# Patient Record
Sex: Female | Born: 1960 | Race: White | Hispanic: No | Marital: Married | Smoking: Never smoker
Health system: Southern US, Community
[De-identification: ages and names within clinical notes are randomized; demographics above are authoritative.]

## PROBLEM LIST (undated history)

## (undated) DIAGNOSIS — M1990 Unspecified osteoarthritis, unspecified site: Secondary | ICD-10-CM

## (undated) DIAGNOSIS — I10 Essential (primary) hypertension: Secondary | ICD-10-CM

## (undated) DIAGNOSIS — D219 Benign neoplasm of connective and other soft tissue, unspecified: Secondary | ICD-10-CM

## (undated) HISTORY — DX: Unspecified osteoarthritis, unspecified site: M19.90

## (undated) HISTORY — DX: Benign neoplasm of connective and other soft tissue, unspecified: D21.9

## (undated) HISTORY — DX: Essential (primary) hypertension: I10

---
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
## 1999-08-20 ENCOUNTER — Other Ambulatory Visit: Admission: RE | Admit: 1999-08-20 | Discharge: 1999-08-20 | Payer: Self-pay | Admitting: Obstetrics and Gynecology

## 2000-08-18 ENCOUNTER — Other Ambulatory Visit: Admission: RE | Admit: 2000-08-18 | Discharge: 2000-08-18 | Payer: Self-pay | Admitting: Obstetrics and Gynecology

## 2001-09-19 ENCOUNTER — Other Ambulatory Visit: Admission: RE | Admit: 2001-09-19 | Discharge: 2001-09-19 | Payer: Self-pay | Admitting: Obstetrics and Gynecology

## 2002-06-06 ENCOUNTER — Ambulatory Visit (HOSPITAL_COMMUNITY): Admission: RE | Admit: 2002-06-06 | Discharge: 2002-06-06 | Payer: Self-pay | Admitting: Obstetrics and Gynecology

## 2002-06-06 ENCOUNTER — Encounter: Payer: Self-pay | Admitting: Obstetrics and Gynecology

## 2002-09-22 ENCOUNTER — Other Ambulatory Visit: Admission: RE | Admit: 2002-09-22 | Discharge: 2002-09-22 | Payer: Self-pay | Admitting: Obstetrics and Gynecology

## 2003-10-10 ENCOUNTER — Ambulatory Visit (HOSPITAL_COMMUNITY): Admission: RE | Admit: 2003-10-10 | Discharge: 2003-10-10 | Payer: Self-pay | Admitting: Obstetrics and Gynecology

## 2003-10-17 ENCOUNTER — Other Ambulatory Visit: Admission: RE | Admit: 2003-10-17 | Discharge: 2003-10-17 | Payer: Self-pay | Admitting: Obstetrics and Gynecology

## 2005-01-07 ENCOUNTER — Other Ambulatory Visit: Admission: RE | Admit: 2005-01-07 | Discharge: 2005-01-07 | Payer: Self-pay | Admitting: Obstetrics and Gynecology

## 2005-02-18 ENCOUNTER — Ambulatory Visit (HOSPITAL_COMMUNITY): Admission: RE | Admit: 2005-02-18 | Discharge: 2005-02-18 | Payer: Self-pay | Admitting: Obstetrics and Gynecology

## 2006-03-10 ENCOUNTER — Ambulatory Visit (HOSPITAL_COMMUNITY): Admission: RE | Admit: 2006-03-10 | Discharge: 2006-03-10 | Payer: Self-pay | Admitting: Obstetrics and Gynecology

## 2006-08-26 ENCOUNTER — Other Ambulatory Visit: Admission: RE | Admit: 2006-08-26 | Discharge: 2006-08-26 | Payer: Self-pay | Admitting: Obstetrics and Gynecology

## 2007-04-07 ENCOUNTER — Ambulatory Visit (HOSPITAL_COMMUNITY): Admission: RE | Admit: 2007-04-07 | Discharge: 2007-04-07 | Payer: Self-pay | Admitting: Obstetrics and Gynecology

## 2007-05-26 HISTORY — PX: ANKLE FRACTURE SURGERY: SHX122

## 2007-09-01 ENCOUNTER — Other Ambulatory Visit: Admission: RE | Admit: 2007-09-01 | Discharge: 2007-09-01 | Payer: Self-pay | Admitting: Obstetrics and Gynecology

## 2008-07-19 ENCOUNTER — Ambulatory Visit (HOSPITAL_COMMUNITY): Admission: RE | Admit: 2008-07-19 | Discharge: 2008-07-19 | Payer: Self-pay | Admitting: Obstetrics and Gynecology

## 2008-12-06 ENCOUNTER — Inpatient Hospital Stay (HOSPITAL_COMMUNITY): Admission: EM | Admit: 2008-12-06 | Discharge: 2008-12-07 | Payer: Self-pay | Admitting: Emergency Medicine

## 2009-09-04 ENCOUNTER — Ambulatory Visit (HOSPITAL_COMMUNITY): Admission: RE | Admit: 2009-09-04 | Discharge: 2009-09-04 | Payer: Self-pay | Admitting: Obstetrics and Gynecology

## 2010-09-01 LAB — POCT I-STAT 4, (NA,K, GLUC, HGB,HCT)
Glucose, Bld: 107 mg/dL — ABNORMAL HIGH (ref 70–99)
HCT: 43 % (ref 36.0–46.0)
Sodium: 137 mEq/L (ref 135–145)

## 2010-09-04 ENCOUNTER — Other Ambulatory Visit: Payer: Self-pay | Admitting: Obstetrics and Gynecology

## 2010-09-04 DIAGNOSIS — Z1231 Encounter for screening mammogram for malignant neoplasm of breast: Secondary | ICD-10-CM

## 2010-09-11 ENCOUNTER — Ambulatory Visit (HOSPITAL_COMMUNITY)
Admission: RE | Admit: 2010-09-11 | Discharge: 2010-09-11 | Disposition: A | Discharge status: Home / Self Care | Payer: BC Managed Care – PPO | Source: Ambulatory Visit | Attending: Obstetrics and Gynecology | Admitting: Obstetrics and Gynecology

## 2010-09-11 DIAGNOSIS — Z1231 Encounter for screening mammogram for malignant neoplasm of breast: Secondary | ICD-10-CM

## 2010-10-07 NOTE — H&P (Signed)
[name], [name] NO.:  [id number]   MEDICAL RECORD NO.:  [id number]          PATIENT TYPE:  INP   LOCATION:  0098                         FACILITY:  [facility]   PHYSICIAN:  [name], M.D.DATE OF BIRTH:  [birth year]   DATE OF ADMISSION:  12/06/2008  DATE OF DISCHARGE:                              HISTORY & PHYSICAL   CHIEF COMPLAINT:  Painful swollen left ankle, left shoulder pain.   HISTORY OF PRESENT ILLNESS:  The patient is a 50 year old female.  She  states she was out walking her dogs today when she stepped wrong,  twisting her left ankle, falling to the ground landing on her left  shoulder.  The patient was brought to the emergency room for evaluation.  X-rays revealed a left ankle __________ trimalleolar fracture.  X-rays  of her shoulder were unremarkable.  The patient was consulted to Dr.  Thomasena Parker.  Dr. Thomasena Parker evaluated the patient, felt that the ankle was not  stable on the distal fibula side.  Recommended ORIF and the patient will  be admitted into the hospital.   PAST MEDICAL HISTORY:  Includes vertigo.   MEDICATIONS:  Meclizine p.r.n.   SOCIAL HISTORY:  Patient is married, lives with her husband.  She denies  any alcohol.  She denies any pregnancy.  She is a Occupational hygienist.   REVIEW OF SYSTEMS:  Negative for any respiratory issues.  Denies any  chest pains, no nausea or vomiting.  She does occasionally have some  issues related to vertigo.  She uses Meclizine with good results.  She  denies being pregnant.  Her last meal was last night.  She had an ice  slurpy at 8 o'clock this morning.   PHYSICAL EXAM:  VITALS:  Temperature was 98.1, pulse 77, respirations  16, blood pressure is 143/80.  GENERAL:  This is a healthy-appearing, well-developed female lying on a  hospital gurney.  She appears to be in no extreme distress.  HEENT:  Head was normal cephalic.  Gross exam intact.  NECK:  Supple.  Good range of motion.  CHEST:  Lung  sounds were clear and equal bilaterally without any  wheezes, rales, rhonchi.  HEART:  Regular rate and rhythm.  ABDOMEN:  Was soft, nontender.  Bowel sounds present.  EXTREMITIES:  Left upper extremity:  He patient was able to raise her  arm up over her shoulder.  She had good range of motion of the elbow and  wrist.  She was very tender at the [facility] joint.  She had no palpable  deformities of the clavicle.  She had good range of motion of her right  shoulder, right wrist and elbow.  LOWER EXTREMITIES:  The patient had full extension of her left hip.  She  could flex it up to 90 degrees.  She was able to bend her knee 0-90  degrees.  She has a swollen, very tender left ankle.  She had good  dorsalis pedis pulses, has good capillary refill.  She had no obvious  skin breakdown about the left ankle.  Exam of her right left lower  extremity was  normal.   Review of x-rays taken at [facility] showed she had a comminuted distal  fibula fracture with a posterior malleolar fracture that Dr. Thomasena Parker  felt was unstable.  X-rays of her left shoulder showed no obvious fractures or  displacements.   IMPRESSION:  1. Left ankle __________ bimalleolar fracture, unstable  2. Left shoulder pain, AC joint sprain.   DISPOSITION:  At this time agree with  patient's findings.  We will  admit [name] to [facility] under the care of Dr. Thomasena Parker.  We will take her to the OR for a planned open reduction internal  fixation of her left ankle.  We will get her up ambulating tomorrow with  physical therapy and we will allow her to discharge home with routine  followup.      [name], P.A.    ______________________________  [name], M.D.    RWK/MEDQ  D:  12/06/2008  T:  12/06/2008  Job:  [id number]   cc:   [name], M.D.  Fax: [phone]

## 2010-10-07 NOTE — Discharge Summary (Signed)
NAMETHRESEA, DOBLE NO.:  [id number]   MEDICAL RECORD NO.:  [id number]          PATIENT TYPE:  INP   LOCATION:  1606                         FACILITY:  [facility]   PHYSICIAN:  Erasmo Leventhal, M.D.DATE OF BIRTH:  03-Apr-1961   DATE OF ADMISSION:  12/06/2008  DATE OF DISCHARGE:  12/07/2008                               DISCHARGE SUMMARY   ADMISSION DIAGNOSIS:  Left ankle unstable bimalleolar fracture.   DISCHARGE DIAGNOSIS:  Open reduction internal fixation of left ankle,  unstable bimalleolar fracture.   HISTORY OF PRESENT ILLNESS:  The patient is a 50 year old female who was  out walking her dog on date of admission.  She twisted her ankle.  She  had immediate pain and swelling.  She was brought to the emergency room.  X-rays were taken of her left ankle and left shoulder.  The patient had  some soreness about the left shoulder in the [facility] joint.  X-rays of the  left shoulder were unremarkable.  X-rays of the left ankle were  interpreted by the radiologist as trimalleolar fracture, but after  evaluating the CT and looking at the x-rays, Dr. Thomasena Edis felt that it  was just a bimalleolar fracture with a posterior malleolus and unstable  comminuted fracture of the distal fibula.  The patient was admitted for  ORIF.   ALLERGIES:  No known drug allergies.   MEDICATIONS ON ADMISSION:  The patient was using vitamin E and  multivitamins.   SURGICAL PROCEDURE:  On December 06, 2008, the patient was taken to the OR  by Dr. Valma Cava, assisted by Dwyane Luo, P.A.-C.  Under general  anesthesia, the patient was underwent an ORIF of her left ankle.  The  patient tolerated the procedure well. There were no complications.  The  patient was transferred to the recovery room and to the orthopedic floor  in good condition.  On the orthopedic floor, the patient had some  significant nausea and vomiting throughout the first night, but this was  improved with IV medications.  The  patient was otherwise comfortable in  the morning without any nausea or vomiting.  She had good nice, pink  toes with good sensation and good motor.  She had no significant  swelling.  The patient was going to work with physical therapy today,  work on ambulation, nonweightbearing with use of crutches.  We are going  to discontinue the IV, and then we will discharge her home later this  afternoon for return to home for convalescence.  We will see her back in  2 weeks for routine casting check.   DISCHARGE INSTRUCTIONS:  Diet:  The patient discharged on a normal diet.   Activity:  The patient should basically be bed rest with the foot  elevated for the first couple of days but slowly may increase her  activity with use of crutches and nonweightbearing on the left leg.   Wound care:  She is to keep her cast clean and dry.   Follow-up:  She needs a follow-up appointment with Dr. Thomasena Edis 2 weeks  from today's date in the office.  The patient is to call [phone] for  that follow-up appointment.   Medications:  1. Percocet 5 mg 1 or 2 tablets every 4-6 hours for pain if needed.  2. Robaxin 500 mg 1 tablet every 6 hours for muscle spasms if needed.  3. Phenergan 1/2 to 1 tablets for nausea if needed.  4. Aspirin; one 325 aspirin a day.  5. Colace 100 mg twice a day while on narcotics, may get OTC.  6. MiraLax 17 g once a day for constipation if needed; may get OTC.  7. The patient is to resume routine home multivitamins and vitamin E.   CONDITION ON DISCHARGE:  To home is listed as improved and good.      Jamelle Rushing, P.A.    ______________________________  Erasmo Leventhal, M.D.    RWK/MEDQ  D:  12/07/2008  T:  12/07/2008  Job:  [id number]   cc:   Erasmo Leventhal, M.D.  Fax: [phone]

## 2010-10-07 NOTE — Op Note (Signed)
[name], [name] NO.:  [id number]   MEDICAL RECORD NO.:  [id number]          PATIENT TYPE:  INP   LOCATION:  0098                         FACILITY:  [facility]   PHYSICIAN:  Erasmo Leventhal, M.D.DATE OF BIRTH:  [birth year]   DATE OF PROCEDURE:  12/06/2008  DATE OF DISCHARGE:                               OPERATIVE REPORT   PREOPERATIVE DIAGNOSES:  1. Left ankle fracture.  2. Comminuted distal fibular fracture, unstable.  3. Associated posteromedial distal tibia fracture   POSTOPERATIVE DIAGNOSES:  1. Left ankle fracture.  2. Comminuted distal fibular fracture, unstable.  3. Associated posteromedial distal tibia fracture   PROCEDURE:  1. Open reduction, internal fixation of distal fibular fracture.  2. Indirect reduction of the posteromedial tibia fracture.  Stress x-      rays, C-arm radiography.   SURGEON:  Dr. Valma Cava.   ASSISTANT:  Dwyane Luo, P.A.-C.   ANESTHESIA:  General.   ESTIMATED BLOOD LOSS:  Less than 20 mL.   DRAINS:  None.   COMPLICATIONS:  None.   TOURNIQUET TIME:  Was 70 minutes at 300 mmHg.   COMPLICATIONS:  None.   DISPOSITION:  To the PACU, stable.   DESCRIPTION OF PROCEDURE:  The patient is counseled in the holding area.  The patient and side were identified. Taken to the operating room and  placed in the supine position and administered general anesthesia.  IV  antibiotics were given.  The cast was removed.  The lower extremity was  elevated.  The skin was closely checked and found be completely intact  circumferentially.  The lateral compartment was soft and good pulses.  Elevation and prepped with DuraPrep and draped in a sterile fashion.  Exsanguinated with an  Esmarch.  The tourniquet was elevated to 300  mmHg.  A longitudinal incision was made over the distal fibula through  the skin and subcutaneous tissue.  The periosteum was opened.  The  fracture was opened.  It was identified and found to be comminuted,  a  Weber type-B distal fibular fracture.  The fascia was opened and  thoroughly irrigated and debrided of soft tissue, and reduced  anatomically and securely fixed from anterior to posterior with a DePuy  titanium inter-fragmentary screw.  We used a DePuy small fragment set.  A composite plate was then properly chosen and bent to contour to the  appropriate size of fibula and held proximally.  At this point in time,  screws were placed distal and proximal alternating, giving excellent  fixation, with a combination of bicortical screws and also locking  screws.  The C-arm was utilized to confirm excellent placement of the  implants and reduction of the fracture.  Also that the posterior tibial  fracture was indirectly reduced by this mechanism of stabilization of  the fibula.  We were very pleased with the reduction and stabilization  of the fracture and implants.  Minor adjustments were made.  The wounds  were copiously irrigated.  The C-arm was again utilized to confirm  stability.   The periosteum was closed with Vicryl.  The subcu with Vicryl.  The  skin  was closed nicely with nylons, being very careful with the skin edges.  Then 10 mL of 0.25% Marcaine was placed in the wound edges at the end of  the case.  Sterile dressings were applied, along with a compressive  plaster splint.  The tourniquet was deflated.  Normal circulation to the  foot and ankle at the end of the case.  Ancef was given intravenously.   She tolerated the procedure well and there were no complications or  problems.  She was awakened and taken from the operating room to the  PACU in stable condition.   Help with the surgical technique and decision making by Mr. Dwyane Luo,  P.A.-C., who assisted throughout the entire case.           ______________________________  Erasmo Leventhal, M.D.     RAC/MEDQ  D:  12/06/2008  T:  12/06/2008  Job:  [id number]

## 2011-09-22 ENCOUNTER — Other Ambulatory Visit: Payer: Self-pay | Admitting: Obstetrics and Gynecology

## 2011-09-22 DIAGNOSIS — Z1231 Encounter for screening mammogram for malignant neoplasm of breast: Secondary | ICD-10-CM

## 2011-10-15 ENCOUNTER — Ambulatory Visit (HOSPITAL_COMMUNITY)
Admission: RE | Admit: 2011-10-15 | Discharge: 2011-10-15 | Disposition: A | Discharge status: Home / Self Care | Payer: BC Managed Care – PPO | Source: Ambulatory Visit | Attending: Obstetrics and Gynecology | Admitting: Obstetrics and Gynecology

## 2011-10-15 DIAGNOSIS — Z1231 Encounter for screening mammogram for malignant neoplasm of breast: Secondary | ICD-10-CM

## 2012-11-02 ENCOUNTER — Other Ambulatory Visit: Payer: Self-pay | Admitting: Obstetrics and Gynecology

## 2012-11-02 DIAGNOSIS — Z1231 Encounter for screening mammogram for malignant neoplasm of breast: Secondary | ICD-10-CM

## 2012-11-08 ENCOUNTER — Ambulatory Visit (HOSPITAL_COMMUNITY)
Admission: RE | Admit: 2012-11-08 | Discharge: 2012-11-08 | Disposition: A | Discharge status: Home / Self Care | Payer: BC Managed Care – PPO | Source: Ambulatory Visit | Attending: Obstetrics and Gynecology | Admitting: Obstetrics and Gynecology

## 2012-11-08 DIAGNOSIS — Z1231 Encounter for screening mammogram for malignant neoplasm of breast: Secondary | ICD-10-CM | POA: Insufficient documentation

## 2013-05-24 ENCOUNTER — Encounter: Payer: Self-pay | Admitting: Certified Nurse Midwife

## 2013-05-29 ENCOUNTER — Ambulatory Visit: Payer: Self-pay | Admitting: Certified Nurse Midwife

## 2013-06-06 ENCOUNTER — Encounter: Payer: Self-pay | Admitting: Certified Nurse Midwife

## 2013-06-06 ENCOUNTER — Ambulatory Visit (INDEPENDENT_AMBULATORY_CARE_PROVIDER_SITE_OTHER): Payer: BC Managed Care – PPO | Admitting: Certified Nurse Midwife

## 2013-06-06 VITALS — BP 112/78 | HR 72 | Resp 16 | Ht 63.25 in

## 2013-06-06 DIAGNOSIS — I10 Essential (primary) hypertension: Secondary | ICD-10-CM | POA: Insufficient documentation

## 2013-06-06 DIAGNOSIS — Z0000 Encounter for general adult medical examination without abnormal findings: Secondary | ICD-10-CM

## 2013-06-06 DIAGNOSIS — Z01419 Encounter for gynecological examination (general) (routine) without abnormal findings: Secondary | ICD-10-CM

## 2013-06-06 NOTE — Progress Notes (Signed)
Reviewed personally.  M. Suzanne Bertina Guthridge, MD.  

## 2013-06-06 NOTE — Progress Notes (Signed)
53 y.o. G0P0000 Married Caucasian Fe here for annual exam. Periods over the past 6 months " 2 real periods with amount and duration, and the other four months regular but small amount. Occasional hot flashes and irritability, no night sweats. No insomnia issues. Hypertension stable on medication with PCP management.Sees PCP every 6 months for labs and aex. No health issues today.  Patient's last menstrual period was 06/04/2013.          Sexually active: no  The current method of family planning is abstinence.    Exercising: yes  walking Smoker:  no  Health Maintenance: Pap:  05-11-12 neg HPV HR NEG MMG: 11-08-12 NORMAL Colonoscopy: none BMD:   none TDaP:  2009 Labs: none Self breast exam: done monthly   reports that she has never smoked. She does not have any smokeless tobacco history on file. She reports that she does not drink alcohol or use illicit drugs.  Past Medical History  Diagnosis Date  . Hypertension   . Fibroid     Past Surgical History  Procedure Laterality Date  . Appendectomy  1982  . Ankle fracture surgery Left 2009    Current Outpatient Prescriptions  Medication Sig Dispense Refill  . BIOTIN PO Take by mouth daily.      . cetirizine (ZYRTEC) 10 MG tablet Take 10 mg by mouth daily.      Marland Kitchen losartan (COZAAR) 50 MG tablet daily.      . Multiple Vitamin (MULTIVITAMIN) capsule Take 1 capsule by mouth daily.      . Omega-3 Fatty Acids (FISH OIL PO) Take by mouth daily.       No current facility-administered medications for this visit.    Family History  Problem Relation Age of Onset  . Cancer Mother     lung  . Diabetes Sister   . Cancer Father     brain  . Hypertension Father   . Cancer Maternal Grandmother     brain  . Cancer Maternal Grandfather     throat  . Stroke Paternal Grandfather     ROS:  Pertinent items are noted in HPI.  Otherwise, a comprehensive ROS was negative.  Exam:   BP 112/78  Pulse 72  Resp 16  Ht 5' 3.25" (1.607 m)  LMP  06/04/2013 Height: 5' 3.25" (160.7 cm)  Ht Readings from Last 3 Encounters:  06/06/13 5' 3.25" (1.607 m)    General appearance: alert, cooperative and appears stated age Head: Normocephalic, without obvious abnormality, atraumatic Neck: no adenopathy, supple, symmetrical, trachea midline and thyroid normal to inspection and palpation Lungs: clear to auscultation bilaterally Breasts: normal appearance, no masses or tenderness, No nipple retraction or dimpling, No nipple discharge or bleeding, No axillary or supraclavicular adenopathy Heart: regular rate and rhythm Abdomen: soft, non-tender; no masses,  no organomegaly Extremities: extremities normal, atraumatic, no cyanosis or edema Skin: Skin color, texture, turgor normal. No rashes or lesions Lymph nodes: Cervical, supraclavicular, and axillary nodes normal. No abnormal inguinal nodes palpated Neurologic: Grossly normal   Pelvic: External genitalia:  no lesions              Urethra:  normal appearing urethra with no masses, tenderness or lesions              Bartholin's and Skene's: normal                 Vagina: normal appearing vagina with normal color and discharge, no lesions  Cervix: normal, non tender              Pap taken: no Bimanual Exam:  Uterus:  enlarged, 10-12 weeks size and mid position, firm, history of fibroid no size change from previous exam              Adnexa: normal adnexa and no mass, fullness, tenderness               Rectovaginal: Confirms               Anus:  normal sphincter tone, no lesions  A:  Well Woman with normal exam  Perimenopausal with cycle changes  Contraception none, not sexually active, her preference  Hypertension on stable medication with PCP management  History of enlarged uterus with fibroid, no size change  P:   Reviewed health and wellness pertinent to exam  Discussed perimenopause and cycle expectations, has menses calendar with normal and abnormal parameters, will  advise if occurs.  Continue follow up as indicated  Pap smear as per guidelines   Mammogram yearly pap smear not taken today IFOB dispensed after discussion of risks and benefits of colonoscopy, patient does not want to schedule at this time.  counseled on breast self exam, mammography screening, menopause, adequate intake of calcium and vitamin D, diet and exercise, Kegel's exercises  return annually or prn  An After Visit Summary was printed and given to the patient.

## 2013-06-06 NOTE — Patient Instructions (Signed)
EXERCISE AND DIET:  We recommended that you start or continue a regular exercise program for good health. Regular exercise means any activity that makes your heart beat faster and makes you sweat.  We recommend exercising at least 30 minutes per day at least 3 days a week, preferably 4 or 5.  We also recommend a diet low in fat and sugar.  Inactivity, poor dietary choices and obesity can cause diabetes, heart attack, stroke, and kidney damage, among others.    ALCOHOL AND SMOKING:  Women should limit their alcohol intake to no more than 7 drinks/beers/glasses of wine (combined, not each!) per week. Moderation of alcohol intake to this level decreases your risk of breast cancer and liver damage. And of course, no recreational drugs are part of a healthy lifestyle.  And absolutely no smoking or even second hand smoke. Most people know smoking can cause heart and lung diseases, but did you know it also contributes to weakening of your bones? Aging of your skin?  Yellowing of your teeth and nails?  CALCIUM AND VITAMIN D:  Adequate intake of calcium and Vitamin D are recommended.  The recommendations for exact amounts of these supplements seem to change often, but generally speaking 600 mg of calcium (either carbonate or citrate) and 800 units of Vitamin D per day seems prudent. Certain women may benefit from higher intake of Vitamin D.  If you are among these women, your doctor will have told you during your visit.    PAP SMEARS:  Pap smears, to check for cervical cancer or precancers,  have traditionally been done yearly, although recent scientific advances have shown that most women can have pap smears less often.  However, every woman still should have a physical exam from her gynecologist every year. It will include a breast check, inspection of the vulva and vagina to check for abnormal growths or skin changes, a visual exam of the cervix, and then an exam to evaluate the size and shape of the uterus and  ovaries.  And after 51 years of age, a rectal exam is indicated to check for rectal cancers. We will also provide age appropriate advice regarding health maintenance, like when you should have certain vaccines, screening for sexually transmitted diseases, bone density testing, colonoscopy, mammograms, etc.   MAMMOGRAMS:  All women over 40 years old should have a yearly mammogram. Many facilities now offer a "3D" mammogram, which may cost around $50 extra out of pocket. If possible,  we recommend you accept the option to have the 3D mammogram performed.  It both reduces the number of women who will be called back for extra views which then turn out to be normal, and it is better than the routine mammogram at detecting truly abnormal areas.    COLONOSCOPY:  Colonoscopy to screen for colon cancer is recommended for all women at age 50.  We know, you hate the idea of the prep.  We agree, BUT, having colon cancer and not knowing it is worse!!  Colon cancer so often starts as a polyp that can be seen and removed at colonscopy, which can quite literally save your life!  And if your first colonoscopy is normal and you have no family history of colon cancer, most women don't have to have it again for 10 years.  Once every ten years, you can do something that may end up saving your life, right?  We will be happy to help you get it scheduled when you are ready.    Be sure to check your insurance coverage so you understand how much it will cost.  It may be covered as a preventative service at no cost, but you should check your particular policy.     Colonoscopy A colonoscopy is an exam to look at the entire large intestine (colon). This exam can help find problems such as tumors, polyps, inflammation, and areas of bleeding. The exam takes about 1 hour.  LET [facility] CARE PROVIDER KNOW ABOUT:   Any allergies you have.  All medicines you are taking, including vitamins, herbs, eye drops, creams, and over-the-counter  medicines.  Previous problems you or members of your family have had with the use of anesthetics.  Any blood disorders you have.  Previous surgeries you have had.  Medical conditions you have. RISKS AND COMPLICATIONS  Generally, this is a safe procedure. However, as with any procedure, complications can occur. Possible complications include:  Bleeding.  Tearing or rupture of the colon wall.  Reaction to medicines given during the exam.  Infection (rare). BEFORE THE PROCEDURE   Ask your health care provider about changing or stopping your regular medicines.  You may be prescribed an oral bowel prep. This involves drinking a large amount of medicated liquid, starting the day before your procedure. The liquid will cause you to have multiple loose stools until your stool is almost clear or light green. This cleans out your colon in preparation for the procedure.  Do not eat or drink anything else once you have started the bowel prep, unless your health care provider tells you it is safe to do so.  Arrange for someone to drive you home after the procedure. PROCEDURE   You will be given medicine to help you relax (sedative).  You will lie on your side with your knees bent.  A long, flexible tube with a light and camera on the end (colonoscope) will be inserted through the rectum and into the colon. The camera sends video back to a computer screen as it moves through the colon. The colonoscope also releases carbon dioxide gas to inflate the colon. This helps your health care provider see the area better.  During the exam, your health care provider may take a small tissue sample (biopsy) to be examined under a microscope if any abnormalities are found.  The exam is finished when the entire colon has been viewed. AFTER THE PROCEDURE   Do not drive for 24 hours after the exam.  You may have a small amount of blood in your stool.  You may pass moderate amounts of gas and have mild  abdominal cramping or bloating. This is caused by the gas used to inflate your colon during the exam.  Ask when your test results will be ready and how you will get your results. Make sure you get your test results. Document Released: 05/08/2000 Document Revised: 03/01/2013 Document Reviewed: 01/16/2013 [facility] Patient Information 2014 [employer].

## 2013-09-15 ENCOUNTER — Ambulatory Visit
Admission: RE | Admit: 2013-09-15 | Discharge: 2013-09-15 | Disposition: A | Discharge status: Home / Self Care | Payer: BC Managed Care – PPO | Source: Ambulatory Visit | Attending: Family Medicine | Admitting: Family Medicine

## 2013-09-15 ENCOUNTER — Other Ambulatory Visit: Payer: Self-pay | Admitting: Family Medicine

## 2013-09-15 DIAGNOSIS — R209 Unspecified disturbances of skin sensation: Secondary | ICD-10-CM

## 2013-09-29 ENCOUNTER — Encounter: Payer: BC Managed Care – PPO | Admitting: Neurology

## 2013-10-04 ENCOUNTER — Encounter (INDEPENDENT_AMBULATORY_CARE_PROVIDER_SITE_OTHER): Payer: Self-pay

## 2013-10-04 ENCOUNTER — Ambulatory Visit (INDEPENDENT_AMBULATORY_CARE_PROVIDER_SITE_OTHER): Payer: BC Managed Care – PPO | Admitting: Neurology

## 2013-10-04 ENCOUNTER — Ambulatory Visit (INDEPENDENT_AMBULATORY_CARE_PROVIDER_SITE_OTHER): Payer: Self-pay

## 2013-10-04 DIAGNOSIS — M79609 Pain in unspecified limb: Secondary | ICD-10-CM

## 2013-10-04 DIAGNOSIS — R209 Unspecified disturbances of skin sensation: Secondary | ICD-10-CM

## 2013-10-04 DIAGNOSIS — Z0289 Encounter for other administrative examinations: Secondary | ICD-10-CM

## 2013-10-04 NOTE — Procedures (Signed)
     HISTORY:  [name] is a 53 year old patient with a history of onset of swelling and discomfort in the right hand that began in early April 2015. She describes some numbness of the hand, and some neck discomfort. The patient is being evaluated for a possible neuropathy or a cervical radiculopathy.  NERVE CONDUCTION STUDIES:  Nerve conduction studies were performed on both upper extremities. The distal motor latencies and motor amplitudes for the median and ulnar nerves were within normal limits. The F wave latencies and nerve conduction velocities for these nerves were also normal. The sensory latencies for the median and ulnar nerves were normal.   EMG STUDIES:  EMG study was performed on the right upper extremity:  The first dorsal interosseous muscle reveals 2 to 4 K units with full recruitment. No fibrillations or positive waves were noted. The abductor pollicis brevis muscle reveals 2 to 4 K units with full recruitment. No fibrillations or positive waves were noted. The extensor indicis proprius muscle reveals 1 to 3 K units with full recruitment. No fibrillations or positive waves were noted. The pronator teres muscle reveals 2 to 3 K units with full recruitment. No fibrillations or positive waves were noted. The biceps muscle reveals 1 to 2 K units with full recruitment. No fibrillations or positive waves were noted. The triceps muscle reveals 2 to 4 K units with full recruitment. No fibrillations or positive waves were noted. The anterior deltoid muscle reveals 2 to 3 K units with full recruitment. No fibrillations or positive waves were noted. The cervical paraspinal muscles were tested at 2 levels. No abnormalities of insertional activity were seen at either level tested. There was good relaxation.   IMPRESSION:  Nerve conduction studies done on both upper extremities were within normal limits. No evidence of a neuropathy is seen. EMG evaluation of the right upper  extremity is normal, without evidence of an overlying cervical radiculopathy.  Jill Alexanders MD 10/04/2013 1:48 PM  [facility] [address] [place], [place] [address]  Phone [phone] Fax [phone]

## 2013-12-14 ENCOUNTER — Other Ambulatory Visit (HOSPITAL_COMMUNITY): Payer: Self-pay | Admitting: Obstetrics and Gynecology

## 2013-12-14 DIAGNOSIS — Z1231 Encounter for screening mammogram for malignant neoplasm of breast: Secondary | ICD-10-CM

## 2013-12-15 ENCOUNTER — Ambulatory Visit (HOSPITAL_COMMUNITY)
Admission: RE | Admit: 2013-12-15 | Discharge: 2013-12-15 | Disposition: A | Discharge status: Home / Self Care | Payer: BC Managed Care – PPO | Source: Ambulatory Visit | Attending: Obstetrics and Gynecology | Admitting: Obstetrics and Gynecology

## 2013-12-15 DIAGNOSIS — Z1231 Encounter for screening mammogram for malignant neoplasm of breast: Secondary | ICD-10-CM

## 2014-06-08 ENCOUNTER — Ambulatory Visit (INDEPENDENT_AMBULATORY_CARE_PROVIDER_SITE_OTHER): Payer: BLUE CROSS/BLUE SHIELD | Admitting: Certified Nurse Midwife

## 2014-06-08 ENCOUNTER — Encounter: Payer: Self-pay | Admitting: Certified Nurse Midwife

## 2014-06-08 VITALS — BP 120/82 | HR 68 | Resp 16 | Ht 63.25 in | Wt 214.0 lb

## 2014-06-08 DIAGNOSIS — Z124 Encounter for screening for malignant neoplasm of cervix: Secondary | ICD-10-CM

## 2014-06-08 DIAGNOSIS — Z0000 Encounter for general adult medical examination without abnormal findings: Secondary | ICD-10-CM

## 2014-06-08 DIAGNOSIS — N951 Menopausal and female climacteric states: Secondary | ICD-10-CM

## 2014-06-08 DIAGNOSIS — Z01419 Encounter for gynecological examination (general) (routine) without abnormal findings: Secondary | ICD-10-CM

## 2014-06-08 LAB — LIPID PANEL
CHOL/HDL RATIO: 3.2 ratio
CHOLESTEROL: 184 mg/dL (ref 0–200)
HDL: 58 mg/dL (ref >39)
LDL CALC: 109 mg/dL — AB (ref 0–99)
Triglycerides: 86 mg/dL (ref <150)
VLDL: 17 mg/dL (ref 0–40)

## 2014-06-08 LAB — POCT URINALYSIS DIPSTICK
Bilirubin, UA: NEGATIVE
Blood, UA: NEGATIVE
GLUCOSE UA: NEGATIVE
KETONES UA: NEGATIVE
Leukocytes, UA: NEGATIVE
NITRITE UA: NEGATIVE
PH UA: 5
PROTEIN UA: NEGATIVE
Urobilinogen, UA: NEGATIVE

## 2014-06-08 LAB — TSH: TSH: 1.9 u[IU]/mL (ref 0.350–4.500)

## 2014-06-08 NOTE — Progress Notes (Signed)
54 y.o. G0P0000 Married  Caucasian Fe here for annual exam. Periods none except for spotting " maybe twice". Continues with hot flashes with some irritability. Hypertension stable with PCP management. Working on weight loss and exercise. Labs with PCP are only CMP. Desires screening labs today. No other health issues today.  Patient's last menstrual period was 05/29/2014.          Sexually active: No.  The current method of family planning is none.    Exercising: Yes.    walk & eliptical Smoker:  no  Health Maintenance: Pap: 05-11-12 neg HPV HR neg MMG:  12-15-13 category c density,birads category 1:neg Colonoscopy:  none BMD:   none TDaP:  2009 Labs: Poct urine-neg Self breast exam: done monthly   reports that she has never smoked. She does not have any smokeless tobacco history on file. She reports that she does not drink alcohol or use illicit drugs.  Past Medical History  Diagnosis Date  . Hypertension   . Fibroid     Past Surgical History  Procedure Laterality Date  . Appendectomy  1982  . Ankle fracture surgery Left 2009    Current Outpatient Prescriptions  Medication Sig Dispense Refill  . cetirizine (ZYRTEC) 10 MG tablet Take 10 mg by mouth daily.    Marland Kitchen losartan (COZAAR) 50 MG tablet 25 mg daily.      No current facility-administered medications for this visit.    Family History  Problem Relation Age of Onset  . Cancer Mother     lung  . Diabetes Sister   . Cancer Father     brain  . Hypertension Father   . Cancer Maternal Grandmother     brain  . Cancer Maternal Grandfather     throat  . Stroke Paternal Grandfather     ROS:  Pertinent items are noted in HPI.  Otherwise, a comprehensive ROS was negative.  Exam:   BP 120/82 mmHg  Pulse 68  Resp 16  Ht 5' 3.25" (1.607 m)  Wt 214 lb (97.07 kg)  BMI 37.59 kg/m2  LMP 05/29/2014 Height: 5' 3.25" (160.7 cm) Ht Readings from Last 3 Encounters:  06/08/14 5' 3.25" (1.607 m)  06/06/13 5' 3.25" (1.607 m)     General appearance: alert, cooperative and appears stated age Head: Normocephalic, without obvious abnormality, atraumatic Neck: no adenopathy, supple, symmetrical, trachea midline and thyroid normal to inspection and palpation Lungs: clear to auscultation bilaterally Breasts: normal appearance, no masses or tenderness, No nipple retraction or dimpling, No nipple discharge or bleeding, No axillary or supraclavicular adenopathy Heart: regular rate and rhythm Abdomen: soft, non-tender; no masses,  no organomegaly Extremities: extremities normal, atraumatic, no cyanosis or edema Skin: Skin color, texture, turgor normal. No rashes or lesions Lymph nodes: Cervical, supraclavicular, and axillary nodes normal. No abnormal inguinal nodes palpated Neurologic: Grossly normal   Pelvic: External genitalia:  no lesions              Urethra:  normal appearing urethra with no masses, tenderness or lesions              Bartholin's and Skene's: normal                 Vagina: normal appearing vagina with normal color and discharge, no lesions              Cervix: normal, non tender, no lesions              Pap taken: Yes.  Bimanual Exam:  Uterus:  normal size, contour, position, consistency, mobility, non-tender              Adnexa: normal adnexa and no mass, fullness, tenderness               Rectovaginal: Confirms               Anus:  normal sphincter tone, no lesions  Chaperone present: Yes  A:  Well Woman with normal exam  Perimenopausal with amenorrhea  Screening labs  Hypertension with PCP management with  Stable medication  P:   Reviewed health and wellness pertinent to exam  Discussed etiology and need to advise if further bleeding.  Labs: FSH, Lipid panel, Prolactin , TSH, Vitamin D  Pap smear taken today with HPV reflex  Continue follow up with MD as indicated   counseled on breast self exam, mammography screening, menopause, adequate intake of calcium and vitamin D, diet and  exercise   An After Visit Summary was printed and given to the patient.

## 2014-06-08 NOTE — Progress Notes (Signed)
Reviewed personally.  M. Suzanne Keaunna Skipper, MD.  

## 2014-06-08 NOTE — Patient Instructions (Signed)

## 2014-06-09 LAB — PROLACTIN: PROLACTIN: 6.1 ng/mL

## 2014-06-09 LAB — VITAMIN D 25 HYDROXY (VIT D DEFICIENCY, FRACTURES): Vit D, 25-Hydroxy: 28 ng/mL — ABNORMAL LOW (ref 30–100)

## 2014-06-09 LAB — FOLLICLE STIMULATING HORMONE: FSH: 97.4 m[IU]/mL

## 2014-06-12 LAB — IPS PAP TEST WITH REFLEX TO HPV

## 2014-09-05 ENCOUNTER — Telehealth: Payer: Self-pay | Admitting: Certified Nurse Midwife

## 2014-09-05 NOTE — Telephone Encounter (Signed)
Left patient a message to call back to reschedule her AEX from 06/10/15 with Johny Shock, CNM. Patient is in recall.

## 2015-02-26 ENCOUNTER — Other Ambulatory Visit: Payer: Self-pay

## 2015-02-26 DIAGNOSIS — Z1231 Encounter for screening mammogram for malignant neoplasm of breast: Secondary | ICD-10-CM

## 2015-03-05 ENCOUNTER — Ambulatory Visit: Payer: Self-pay

## 2015-03-14 ENCOUNTER — Ambulatory Visit: Payer: Self-pay

## 2015-03-26 ENCOUNTER — Ambulatory Visit: Payer: Self-pay

## 2015-04-16 ENCOUNTER — Ambulatory Visit
Admission: RE | Admit: 2015-04-16 | Discharge: 2015-04-16 | Disposition: A | Discharge status: Home / Self Care | Payer: BLUE CROSS/BLUE SHIELD | Source: Ambulatory Visit

## 2015-04-16 DIAGNOSIS — Z1231 Encounter for screening mammogram for malignant neoplasm of breast: Secondary | ICD-10-CM

## 2015-06-10 ENCOUNTER — Ambulatory Visit: Payer: BLUE CROSS/BLUE SHIELD | Admitting: Certified Nurse Midwife

## 2015-06-11 ENCOUNTER — Ambulatory Visit: Payer: BLUE CROSS/BLUE SHIELD | Admitting: Certified Nurse Midwife

## 2015-07-24 ENCOUNTER — Ambulatory Visit (INDEPENDENT_AMBULATORY_CARE_PROVIDER_SITE_OTHER): Payer: 59 | Admitting: Certified Nurse Midwife

## 2015-07-24 ENCOUNTER — Encounter: Payer: Self-pay | Admitting: Certified Nurse Midwife

## 2015-07-24 VITALS — BP 114/70 | HR 70 | Resp 16 | Ht 63.25 in

## 2015-07-24 DIAGNOSIS — Z01419 Encounter for gynecological examination (general) (routine) without abnormal findings: Secondary | ICD-10-CM | POA: Diagnosis not present

## 2015-07-24 NOTE — Patient Instructions (Signed)

## 2015-07-24 NOTE — Progress Notes (Signed)
55 y.o. G0P0000 Married  Caucasian Fe here for annual exam. Menopausal no HRT, denies vaginal bleeding or dryness. Sees PCP Dr. Justin Mend for aex/labs and hypertension management. Medication has been reduced due to BP is much better with ? Weight loss. Refuses to be weighed. Hot flashes and night sweats minimal now, no issues with insomnia. No health issues today.  Patient's last menstrual period was 05/29/2014.          Sexually active: No.  The current method of family planning is post menopausal status.    Exercising: Yes.    walking Smoker:  no  Health Maintenance: Pap:  06-08-14 neg MMG:  04-16-15 category c density,birads 1:neg Colonoscopy:  none BMD:   none TDaP:  2/17 Shingles: no Pneumonia: no Hep C and HIV: not done Labs: pcp Self breast exam: done monthly   reports that she has never smoked. She does not have any smokeless tobacco history on file. She reports that she drinks alcohol. She reports that she does not use illicit drugs.  Past Medical History  Diagnosis Date  . Hypertension   . Fibroid   . Arthritis     in back    Past Surgical History  Procedure Laterality Date  . Appendectomy  1982  . Ankle fracture surgery Left 2009    Current Outpatient Prescriptions  Medication Sig Dispense Refill  . cetirizine (ZYRTEC) 10 MG tablet Take 10 mg by mouth daily.    . Cholecalciferol (VITAMIN D PO) Take by mouth daily.    Marland Kitchen losartan (COZAAR) 25 MG tablet Take 25 mg by mouth daily.  1   No current facility-administered medications for this visit.    Family History  Problem Relation Age of Onset  . Cancer Mother     lung  . Diabetes Sister   . Cancer Father     brain  . Hypertension Father   . Cancer Maternal Grandmother     brain  . Cancer Maternal Grandfather     throat  . Stroke Paternal Grandfather     ROS:  Pertinent items are noted in HPI.  Otherwise, a comprehensive ROS was negative.  Exam:   BP 114/70 mmHg  Pulse 70  Resp 16  Ht 5' 3.25" (1.607  m)  Wt   LMP 05/29/2014 Height: 5' 3.25" (160.7 cm) Ht Readings from Last 3 Encounters:  07/24/15 5' 3.25" (1.607 m)  06/08/14 5' 3.25" (1.607 m)  06/06/13 5' 3.25" (1.607 m)    General appearance: alert, cooperative and appears stated age Head: Normocephalic, without obvious abnormality, atraumatic Neck: no adenopathy, supple, symmetrical, trachea midline and thyroid normal to inspection and palpation Lungs: clear to auscultation bilaterally Breasts: normal appearance, no masses or tenderness, No nipple retraction or dimpling, No nipple discharge or bleeding, No axillary or supraclavicular adenopathy Heart: regular rate and rhythm Abdomen: soft, non-tender; no masses,  no organomegaly Extremities: extremities normal, atraumatic, no cyanosis or edema Skin: Skin color, texture, turgor normal. No rashes or lesions Lymph nodes: Cervical, supraclavicular, and axillary nodes normal. No abnormal inguinal nodes palpated Neurologic: Grossly normal   Pelvic: External genitalia:  no lesions              Urethra:  normal appearing urethra with no masses, tenderness or lesions              Bartholin's and Skene's: normal                 Vagina: normal appearing vagina with normal color  and discharge, no lesions              Cervix: normal,non tender no lesions              Pap taken: No. Bimanual Exam:  Uterus:  normal size, contour, position, consistency, mobility, non-tender              Adnexa: normal adnexa and no mass, fullness, tenderness               Rectovaginal: Confirms               Anus:  normal sphincter tone, no lesions  Chaperone present: yes  A:  Well Woman with normal exam  Menopausal no HRT  Hypertension on stable medication with PCP management  P:   Reviewed health and wellness pertinent to exam  Discussed need to advise if vaginal bleeding   Continue MD follow up as indicated  Pap smear as above not taken    counseled on breast self exam, mammography screening,  adequate intake of calcium and vitamin D, diet and exercise  return annually or prn  An After Visit Summary was printed and given to the patient.

## 2015-07-31 NOTE — Progress Notes (Signed)
Encounter reviewed Jill Jertson, MD   

## 2016-02-21 IMAGING — CR DG CERVICAL SPINE 2 OR 3 VIEWS
4 series · 4 of 4 positions shown · non-contrast
Comparison: None.

CLINICAL DATA: Right arm paresthesia, fell 5 years ago with some
decrease motion of the right shoulder

EXAM:
CERVICAL SPINE - 2-3 VIEW

[view not recorded (1 of 4)]
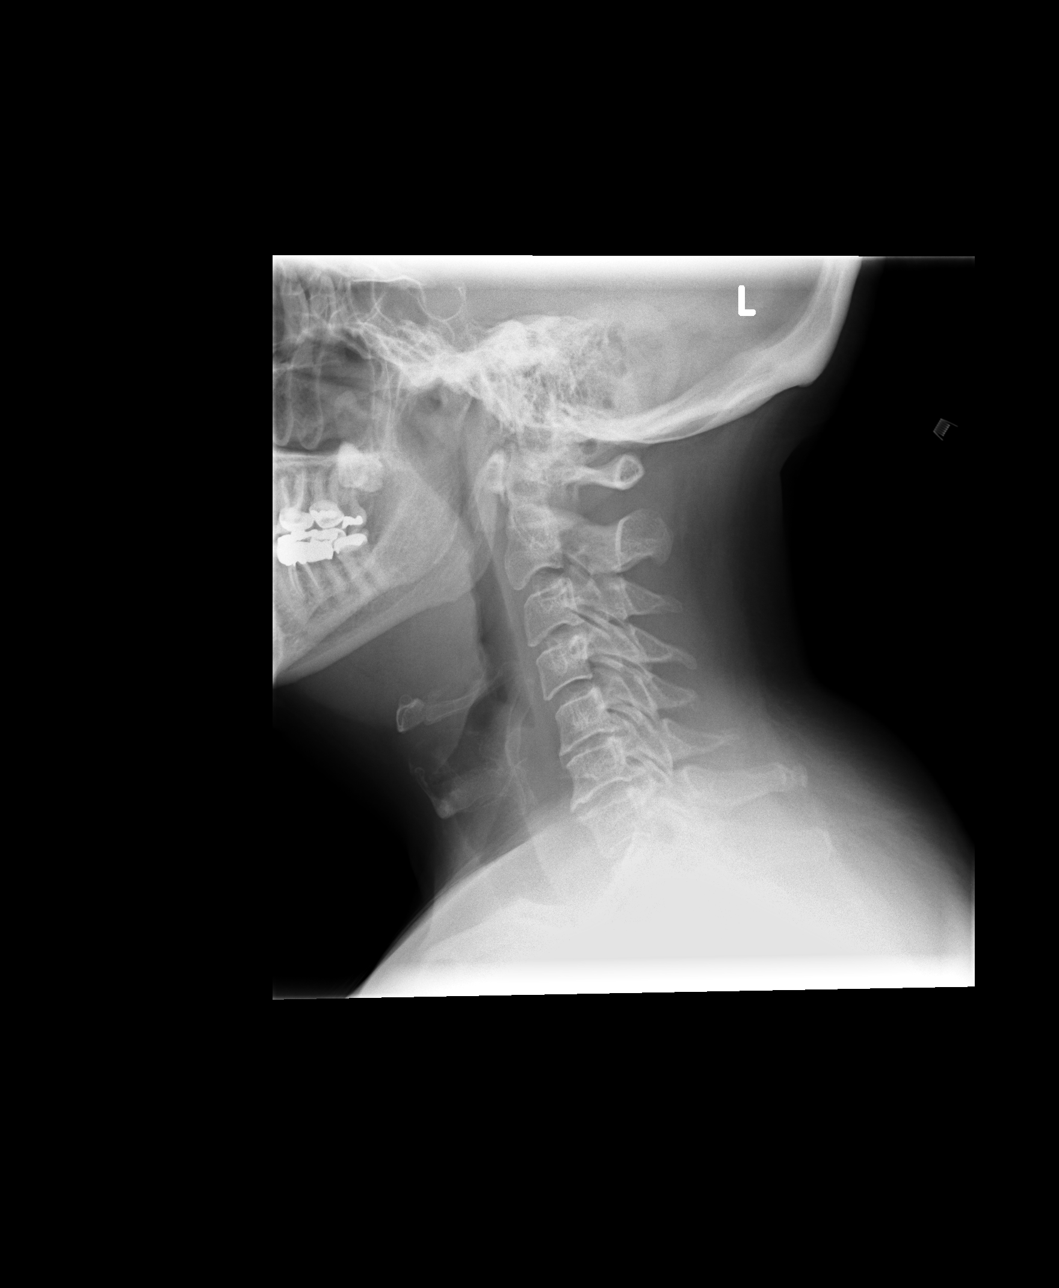

[view not recorded (2 of 4)]
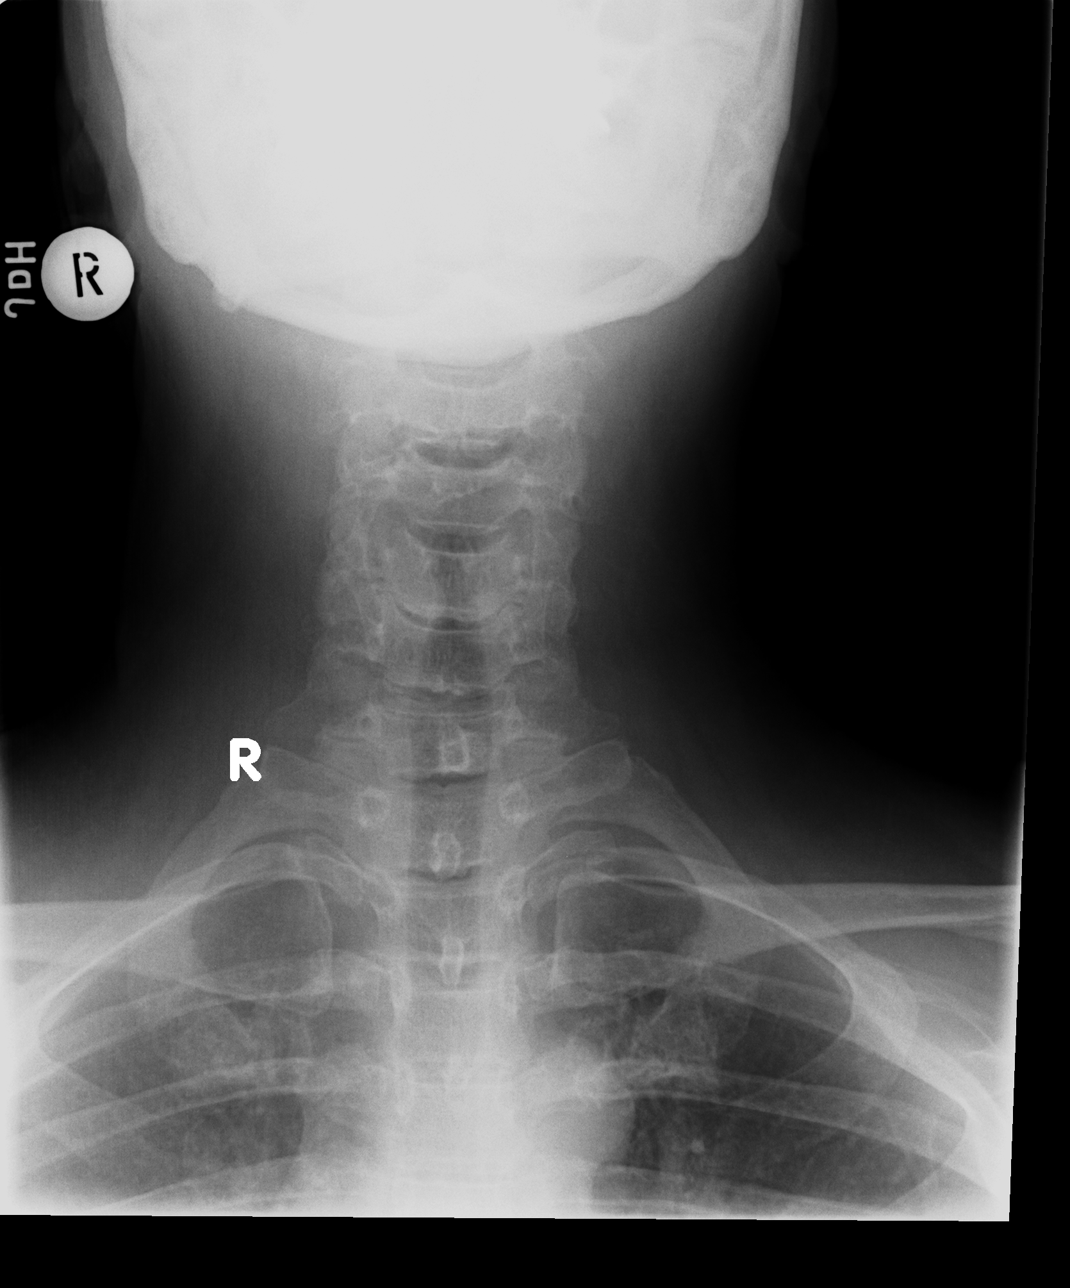

[view not recorded (3 of 4)]
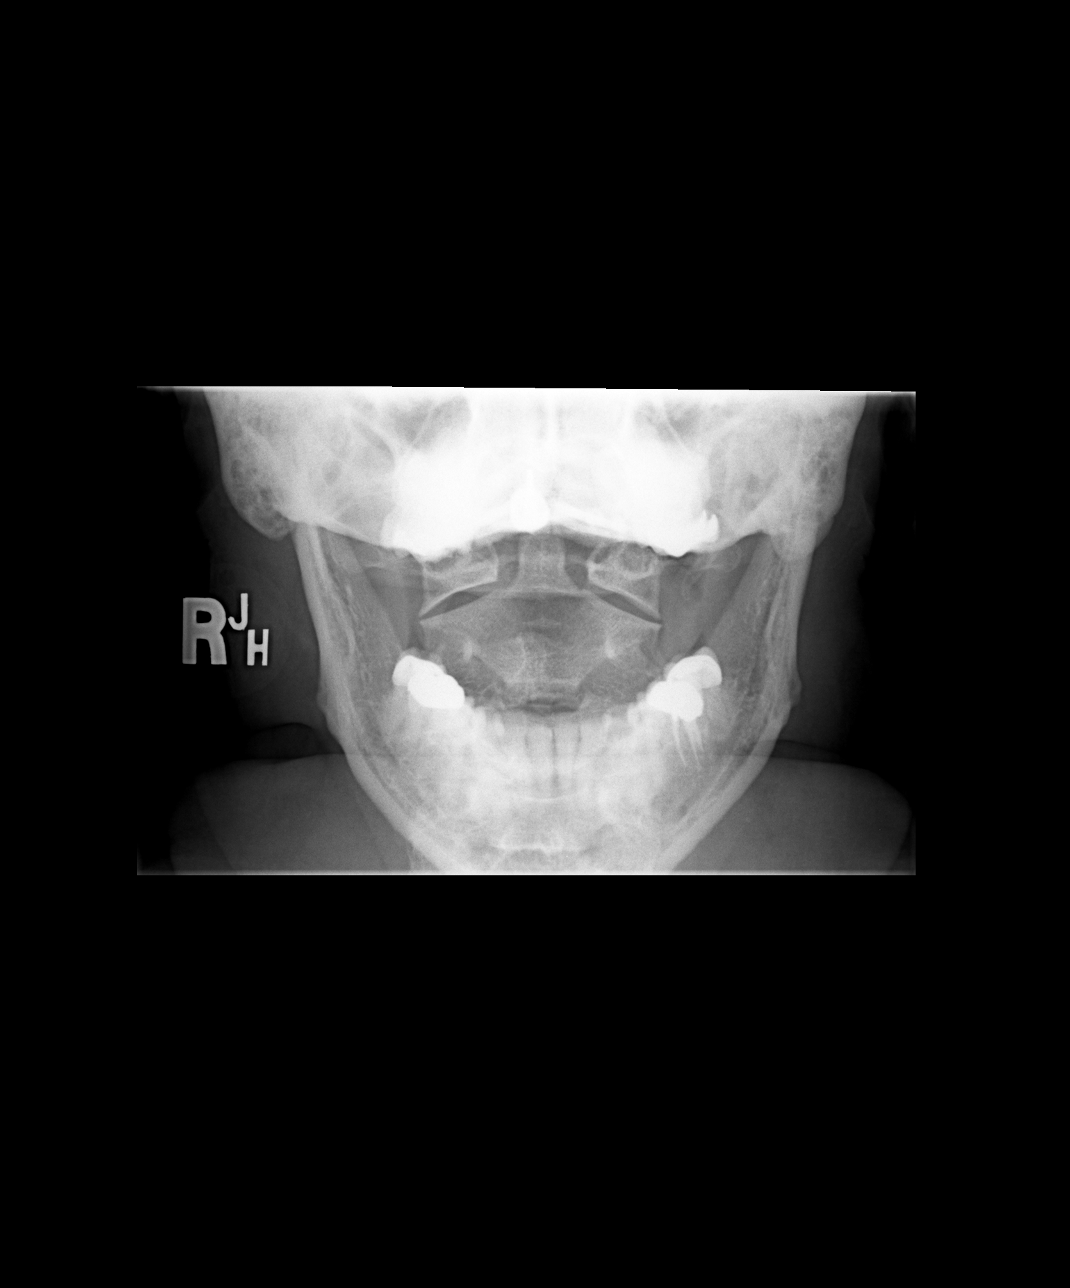

[view not recorded (4 of 4)]
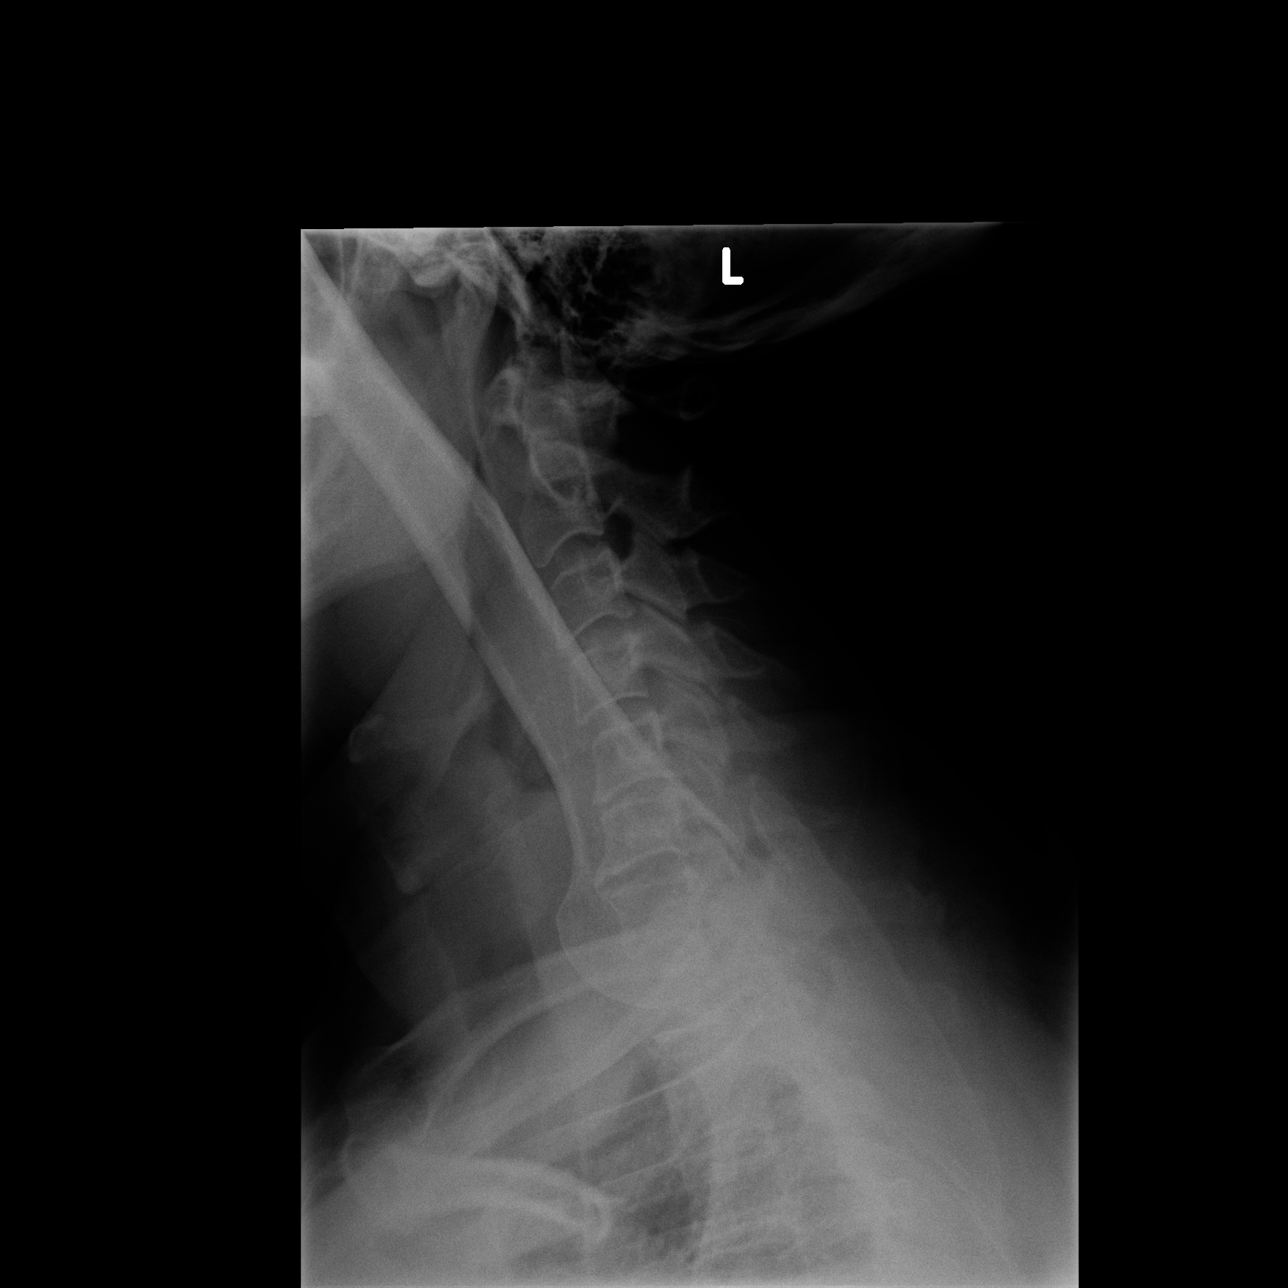

[4 of 4 positions shown; findings below may reference images not displayed]

FINDINGS: The cervical vertebrae are in normal alignment. There is
degenerative disc disease 12 mild degree at C5-6 and C6-7 levels
where there is loss of disc space and spurring with sclerosis. No
prevertebral soft tissue swelling is seen. The odontoid process is
intact. The lung apices are clear.
IMPRESSION: Straightened alignment. Mild degenerative disc disease at C5-6 and
C6-7.

## 2016-04-27 ENCOUNTER — Other Ambulatory Visit: Payer: Self-pay | Admitting: Certified Nurse Midwife

## 2016-04-27 ENCOUNTER — Telehealth: Payer: Self-pay | Admitting: Certified Nurse Midwife

## 2016-04-27 DIAGNOSIS — Z1231 Encounter for screening mammogram for malignant neoplasm of breast: Secondary | ICD-10-CM

## 2016-04-27 NOTE — Telephone Encounter (Signed)
Spoke with patient. Patient states that she is having left breast pain. Reports she has been working out more recently and is unsure if the area is tender from that. Denies any redness, swelling, warmth, or nipple discharge. Advised she will need to be seen in the office for a breast check. Patient is agreeable. Appointment scheduled for 04/30/2016 at 4 pm with Melvia Heaps CNM. Patient is agreeable to date and time. Aware if symptoms worsen or develops any new symptoms she will need to be seen earlier for evaluation.  Routing to provider for final review. Patient agreeable to disposition. Will close encounter.

## 2016-04-27 NOTE — Telephone Encounter (Signed)
Patient is having some pain in her left breast and is asking for an order for a diagnostic mammogram.

## 2016-04-30 ENCOUNTER — Encounter: Payer: Self-pay | Admitting: Certified Nurse Midwife

## 2016-04-30 ENCOUNTER — Ambulatory Visit (INDEPENDENT_AMBULATORY_CARE_PROVIDER_SITE_OTHER): Payer: 59 | Admitting: Certified Nurse Midwife

## 2016-04-30 VITALS — BP 120/76 | HR 70 | Resp 16 | Ht 63.25 in | Wt 197.0 lb

## 2016-04-30 DIAGNOSIS — N644 Mastodynia: Secondary | ICD-10-CM

## 2016-04-30 NOTE — Progress Notes (Signed)
Encounter reviewed Jill Jertson, MD   

## 2016-04-30 NOTE — Progress Notes (Signed)
   Subjective:   55 y.o. Married Caucasian female presents for evaluation of left breast tenderness.. Onset of the symptoms was one week ago. Patient sought evaluation because of breast tenderness and tissue feel change.  Contributing factors include family hx on mother's side, maternal aunt.. Denies weight loss. Patient denies hiistory of trauma, bites, or injuries. Last mammogram was 04/17/2015.  Patient has been working out using upper body machines and feels this may be contributing. No underwire bra use.Denies redness or nipple discharge. Just persistent soreness in inner area of left breast near sternum. No other health issues today.   Review of Systems Pertinent items are noted in HPI.@SUBJECTIVE    Objective:   General appearance: alert, cooperative, appears stated age and no distress Breasts: normal appearance, no masses or tenderness, No nipple retraction or dimpling, No nipple discharge or bleeding, No axillary or supraclavicular adenopathy, tenderness noted at 9 o'clock with slight change in texture of breast feel. No masses noted.  Assessment:   ASSESSMENT:Patient is diagnosed with breast soreness left breast with change of breast texture noted. Plan:   PLAN: Discussed finding with patient and feel evaluation needed and due for routine screening. Will do diagnostic and [place] if needed for evaluation. Patient agreeable.  Rv prn

## 2016-04-30 NOTE — Progress Notes (Signed)
Spoke with [name] at [facility]. Called to schedule Bilateral Diagnostic MMG with Left breast ultrasound. Patient declined first available appt for 12/8 at 10:30am. Patient scheduled for 05/12/16 arriving at 2:40pm, appt at 3pm. Patient verbalizes understanding and is agreeable to date and time.

## 2016-04-30 NOTE — Patient Instructions (Signed)
Breast Tenderness Breast tenderness is a common problem for women of all ages. Breast tenderness may cause mild discomfort to severe pain. The pain usually comes and goes in association with your menstrual cycle, but it can be constant. Breast tenderness has many possible causes, including hormone changes and some medicines. Your health care provider may order tests, such as a mammogram or an ultrasound, to check for any unusual findings. Having breast tenderness usually does not mean that you have breast cancer. Follow these instructions at home: Sometimes, reassurance that you do not have breast cancer is all that is needed. In general, follow these home care instructions: Managing pain and discomfort  If directed, apply ice to the area:  Put ice in a plastic bag.  Place a towel between your skin and the bag.  Leave the ice on for 20 minutes, 2-3 times a day.  Make sure you are wearing a supportive bra, especially during exercise. You may also want to wear a supportive bra while sleeping if your breasts are very tender. Medicines  Take over-the-counter and prescription medicines only as told by your health care provider. If the cause of your pain is infection, you may be prescribed an antibiotic medicine.  If you were prescribed an antibiotic, take it as told by your health care provider. Do not stop taking the antibiotic even if you start to feel better. General instructions  Your health care provider may recommend that you reduce the amount of fat in your diet. You can do this by:  Limiting fried foods.  Cooking foods using methods, such as baking, boiling, grilling, and broiling.  Decrease the amount of caffeine in your diet. You can do this by drinking more water and choosing caffeine-free options.  Keep a log of the days and times when your breasts are most tender.  Ask your health care provider how to do breast exams at home. This will help you notice if you have an unusual  growth or lump. Contact a health care provider if:  Any part of your breast is hard, red, and hot to the touch. This may be a sign of infection.  You are not breastfeeding and you have fluid, especially blood or pus, coming out of your nipples.  You have a fever.  You have a new or painful lump in your breast that remains after your menstrual period ends.  Your pain does not improve or it gets worse.  Your pain is interfering with your daily activities. This information is not intended to replace advice given to you by your health care provider. Make sure you discuss any questions you have with your health care provider. Document Released: 04/23/2008 Document Revised: 02/07/2016 Document Reviewed: 02/07/2016 Elsevier Interactive Patient Education  2017 Elsevier Inc.  

## 2016-05-12 ENCOUNTER — Other Ambulatory Visit: Payer: BLUE CROSS/BLUE SHIELD

## 2016-05-12 ENCOUNTER — Ambulatory Visit
Admission: RE | Admit: 2016-05-12 | Discharge: 2016-05-12 | Disposition: A | Discharge status: Home / Self Care | Payer: 59 | Source: Ambulatory Visit | Attending: Certified Nurse Midwife | Admitting: Certified Nurse Midwife

## 2016-05-12 DIAGNOSIS — N644 Mastodynia: Secondary | ICD-10-CM

## 2016-05-13 ENCOUNTER — Telehealth: Payer: Self-pay | Admitting: *Deleted

## 2016-05-13 NOTE — Telephone Encounter (Signed)
Spoke with patient, advised as seen below per Melvia Heaps, CNM. Patient states she is still experiencing tenderness after workouts, but not like before. Patient states the tenderness has settled a little bit. Patient scheduled for breast 6 wk recheck 06/17/16 at 0800. Patient verbalizes understanding and is agreeable to date and time.  Routing to provider for final review. Patient is agreeable to disposition. Will close encounter.

## 2016-05-13 NOTE — Telephone Encounter (Signed)
-----   Message from Regina Eck, CNM sent at 05/13/2016  8:07 AM EST ----- Patient can be taken out of hold if her tenderness has subsided. Mammogram reviewed and [place] and no concerning abnormalities noted. I did not feel any at her exam. She had been doing different work outs and was the probable cause. She will need call to determine if still present. If so needs OV follow up

## 2016-05-13 NOTE — Telephone Encounter (Signed)
Left message to call Tamrah Victorino at [phone].  

## 2016-06-17 ENCOUNTER — Ambulatory Visit: Payer: Self-pay | Admitting: Certified Nurse Midwife

## 2016-07-28 ENCOUNTER — Ambulatory Visit: Payer: 59 | Admitting: Certified Nurse Midwife

## 2016-09-29 ENCOUNTER — Ambulatory Visit: Payer: 59 | Admitting: Certified Nurse Midwife

## 2016-11-19 ENCOUNTER — Other Ambulatory Visit (HOSPITAL_COMMUNITY)
Admission: RE | Admit: 2016-11-19 | Discharge: 2016-11-19 | Disposition: A | Discharge status: Home / Self Care | Payer: 59 | Source: Ambulatory Visit | Attending: Certified Nurse Midwife | Admitting: Certified Nurse Midwife

## 2016-11-19 ENCOUNTER — Encounter: Payer: Self-pay | Admitting: Certified Nurse Midwife

## 2016-11-19 ENCOUNTER — Ambulatory Visit (INDEPENDENT_AMBULATORY_CARE_PROVIDER_SITE_OTHER): Payer: 59 | Admitting: Certified Nurse Midwife

## 2016-11-19 VITALS — BP 122/82 | HR 70 | Resp 16 | Ht 62.75 in

## 2016-11-19 DIAGNOSIS — Z124 Encounter for screening for malignant neoplasm of cervix: Secondary | ICD-10-CM

## 2016-11-19 DIAGNOSIS — Z1211 Encounter for screening for malignant neoplasm of colon: Secondary | ICD-10-CM | POA: Diagnosis not present

## 2016-11-19 DIAGNOSIS — Z01419 Encounter for gynecological examination (general) (routine) without abnormal findings: Secondary | ICD-10-CM | POA: Diagnosis not present

## 2016-11-19 DIAGNOSIS — R42 Dizziness and giddiness: Secondary | ICD-10-CM | POA: Insufficient documentation

## 2016-11-19 NOTE — Patient Instructions (Signed)

## 2016-11-19 NOTE — Progress Notes (Signed)
56 y.o. G0P0000 Married  Caucasian Fe here for annual exam. Menopausal no HRT. Denies vaginal bleeding or vaginal dryness. Has recently quit job and now starting college again in [employer]. Plans to start on weight loss soon so she will sleep better. Declines weighing today, but clothes still fit. Saw PCP earlier this year for hypertension, now off medication. Had labs done then, declines today. Does not plan on colonoscopy this year. No other health concerns today.  Patient's last menstrual period was 05/29/2014.          Sexually active: Yes.    The current method of family planning is post menopausal status.    Exercising: No.  exercise Smoker:  no  Health Maintenance: Pap:  06-08-14 neg History of Abnormal Pap: no MMG:  05-12-16 bilateral & left breast category c density birads 2:neg Self Breast exams: yes Colonoscopy:  none BMD:   none TDaP:  2/17 Shingles: no Pneumonia: no Hep C and HIV: maybe when she had surgery Labs: none   reports that she has never smoked. She has never used smokeless tobacco. She reports that she drinks alcohol. She reports that she does not use drugs.  Past Medical History:  Diagnosis Date  . Arthritis    in back  . Fibroid   . Hypertension     Past Surgical History:  Procedure Laterality Date  . ANKLE FRACTURE SURGERY Left 2009  . APPENDECTOMY  1982    Current Outpatient Prescriptions  Medication Sig Dispense Refill  . cetirizine (ZYRTEC) 10 MG tablet Take 10 mg by mouth daily.     No current facility-administered medications for this visit.     Family History  Problem Relation Age of Onset  . Cancer Mother        lung  . Diabetes Sister   . Cancer Father        brain  . Hypertension Father   . Cancer Maternal Grandmother        brain  . Cancer Maternal Grandfather        throat  . Stroke Paternal Grandfather     ROS:  Pertinent items are noted in HPI.  Otherwise, a comprehensive ROS was negative.  Exam:   BP 122/82    Pulse 70   Resp 16   Ht 5' 2.75" (1.594 m)   LMP 05/29/2014  Height: 5' 2.75" (159.4 cm) Ht Readings from Last 3 Encounters:  11/19/16 5' 2.75" (1.594 m)  04/30/16 5' 3.25" (1.607 m)  07/24/15 5' 3.25" (1.607 m)    General appearance: alert, cooperative and appears stated age Head: Normocephalic, without obvious abnormality, atraumatic Neck: no adenopathy, supple, symmetrical, trachea midline and thyroid normal to inspection and palpation Lungs: clear to auscultation bilaterally Breasts: normal appearance, no masses or tenderness, No nipple retraction or dimpling, No nipple discharge or bleeding, No axillary or supraclavicular adenopathy Heart: regular rate and rhythm Abdomen: soft, non-tender; no masses,  no organomegaly Extremities: extremities normal, atraumatic, no cyanosis or edema Skin: Skin color, texture, turgor normal. No rashes or lesions Lymph nodes: Cervical, supraclavicular, and axillary nodes normal. No abnormal inguinal nodes palpated Neurologic: Grossly normal   Pelvic: External genitalia:  no lesions              Urethra:  normal appearing urethra with no masses, tenderness or lesions              Bartholin's and Skene's: normal  Vagina: normal appearing vagina with normal color and discharge, no lesions              Cervix: no cervical motion tenderness, no lesions and normal appearance              Pap taken: Yes.   Bimanual Exam:  Uterus:  normal size, contour, position, consistency, mobility, non-tender              Adnexa: normal adnexa, no mass, fullness, tenderness and limited by body habitus               Rectovaginal: Confirms               Anus:  normal sphincter tone, no lesions  Chaperone present: yes  A:  Well Woman with normal exam  Menopausal no HRT  Atrophic vaginitis  Colonoscopy due  Normal tensive now no medication use now being followed by PCP with labs      P:   Reviewed health and wellness pertinent to exam  Aware  of need to evaluate if vaginal bleeding  Discussed atrophic vaginitis and etiology. Discussed OTC management with replens, [place] pearls or coconut oil or estrogen use. Patient will consider and will use OTC or call if needs further advise.  Discussed benefits and risks of colonoscopy, declines scheduling. Discussed cologard and declines at this time. IFOB dispensed with instructions.  Continue PCP follow up as indicated  Pap smear: yes  counseled on breast self exam, mammography screening, feminine hygiene, adequate intake of calcium and vitamin D, diet and exercise  return annually or prn  An After Visit Summary was printed and given to the patient.

## 2016-11-24 LAB — CYTOLOGY - PAP
DIAGNOSIS: NEGATIVE
HPV: NOT DETECTED

## 2016-12-07 LAB — FECAL OCCULT BLOOD, IMMUNOCHEMICAL: IFOBT: NEGATIVE

## 2016-12-07 NOTE — Addendum Note (Signed)
Addended by: Polly Cobia on: 12/07/2016 08:48 AM   Modules accepted: Orders

## 2017-09-02 DIAGNOSIS — I10 Essential (primary) hypertension: Secondary | ICD-10-CM | POA: Diagnosis not present

## 2017-09-02 DIAGNOSIS — R42 Dizziness and giddiness: Secondary | ICD-10-CM | POA: Diagnosis not present

## 2017-09-24 ENCOUNTER — Other Ambulatory Visit: Payer: Self-pay | Admitting: Certified Nurse Midwife

## 2017-09-24 DIAGNOSIS — Z1231 Encounter for screening mammogram for malignant neoplasm of breast: Secondary | ICD-10-CM

## 2017-09-28 ENCOUNTER — Ambulatory Visit
Admission: RE | Admit: 2017-09-28 | Discharge: 2017-09-28 | Disposition: A | Discharge status: Home / Self Care | Payer: 59 | Source: Ambulatory Visit | Attending: Certified Nurse Midwife | Admitting: Certified Nurse Midwife

## 2017-09-28 DIAGNOSIS — Z1231 Encounter for screening mammogram for malignant neoplasm of breast: Secondary | ICD-10-CM

## 2017-10-08 ENCOUNTER — Telehealth: Payer: Self-pay | Admitting: Certified Nurse Midwife

## 2017-10-08 NOTE — Telephone Encounter (Signed)
Left message on voicemail to call and reschedule cancelled appointment. °

## 2017-11-23 ENCOUNTER — Ambulatory Visit: Payer: 59 | Admitting: Certified Nurse Midwife

## 2017-12-08 ENCOUNTER — Ambulatory Visit: Payer: 59 | Admitting: Certified Nurse Midwife

## 2017-12-08 ENCOUNTER — Telehealth: Payer: Self-pay | Admitting: Certified Nurse Midwife

## 2017-12-08 ENCOUNTER — Other Ambulatory Visit (HOSPITAL_COMMUNITY)
Admission: RE | Admit: 2017-12-08 | Discharge: 2017-12-08 | Disposition: A | Discharge status: Home / Self Care | Payer: BLUE CROSS/BLUE SHIELD | Source: Ambulatory Visit | Attending: Obstetrics & Gynecology | Admitting: Obstetrics & Gynecology

## 2017-12-08 ENCOUNTER — Encounter: Payer: Self-pay | Admitting: Certified Nurse Midwife

## 2017-12-08 ENCOUNTER — Other Ambulatory Visit: Payer: Self-pay

## 2017-12-08 VITALS — BP 120/78 | HR 74 | Resp 14 | Ht 64.0 in

## 2017-12-08 DIAGNOSIS — Z124 Encounter for screening for malignant neoplasm of cervix: Secondary | ICD-10-CM | POA: Diagnosis not present

## 2017-12-08 DIAGNOSIS — E559 Vitamin D deficiency, unspecified: Secondary | ICD-10-CM | POA: Diagnosis not present

## 2017-12-08 DIAGNOSIS — E663 Overweight: Secondary | ICD-10-CM

## 2017-12-08 DIAGNOSIS — Z0000 Encounter for general adult medical examination without abnormal findings: Secondary | ICD-10-CM

## 2017-12-08 DIAGNOSIS — Z01419 Encounter for gynecological examination (general) (routine) without abnormal findings: Secondary | ICD-10-CM

## 2017-12-08 DIAGNOSIS — Z8679 Personal history of other diseases of the circulatory system: Secondary | ICD-10-CM

## 2017-12-08 NOTE — Telephone Encounter (Signed)
Cologuard order faxed to [employer].   Routing to provider for final review. Patient is agreeable to disposition. Will close encounter.

## 2017-12-08 NOTE — Telephone Encounter (Signed)
Spoke with patient, request to proceed with Cologuard screening as discussed at [place] today. Cologuard process explained to patient, patient verbalizes understanding.   Completed Cologuard order to Melvia Heaps, CNM for signature.

## 2017-12-08 NOTE — Telephone Encounter (Signed)
Patient spoke with her insurance company and learned that the cologuard is covered by her insurance. Patient would like to proceed with ordering this cologuard.

## 2017-12-08 NOTE — Progress Notes (Signed)
57 y.o. G0P0000 Married  Caucasian Fe here for annual exam. Denies vaginal bleeding or vaginal dryness. Seen in [place]for vertigo and was treated with medication Zyrtec, and feeling better.  This will be patient's last visit here. Will be moving to [place] for job promotion in September. Recent eye exam all normal. Sees [facility] care if needed. Still working on weight and feels she can be more consistent once they have moved. Screening labs if needed today. Denies any other health issues today.     Patient's last menstrual period was 05/29/2014.          Sexually active: Yes.    The current method of family planning is post menopausal status.    Exercising: No.  The patient does not participate in regular exercise at present. Smoker:  no  Health Maintenance: Pap:  11-19-16 negative, HR HPV negative          06-08-14  Negative  History of Abnormal Pap: no MMG:  09-28-17 BIRADS 1 negative  Self Breast exams: yes Colonoscopy:  none BMD:   none TDaP:  06-26-15  Shingles: no Pneumonia: no Hep C and HIV: unsure Labs: if needed   reports that she has never smoked. She has never used smokeless tobacco. She reports that she drinks alcohol. She reports that she does not use drugs.  Past Medical History:  Diagnosis Date  . Arthritis    in back  . Fibroid   . Hypertension     Past Surgical History:  Procedure Laterality Date  . ANKLE FRACTURE SURGERY Left 2009  . APPENDECTOMY  1982    Current Outpatient Medications  Medication Sig Dispense Refill  . cetirizine (ZYRTEC) 10 MG tablet Take 10 mg by mouth daily.    Marland Kitchen ibuprofen (ADVIL,MOTRIN) 200 MG tablet Take by mouth.    . Multiple Vitamins-Minerals (MULTIVITAMIN PO) Take by mouth.     No current facility-administered medications for this visit.     Family History  Problem Relation Age of Onset  . Cancer Mother        lung  . Diabetes Sister   . Cancer Father        brain  . Hypertension Father   . Cancer Maternal  Grandmother        brain  . Cancer Maternal Grandfather        throat  . Stroke Paternal Grandfather     ROS:  Pertinent items are noted in HPI.  Otherwise, a comprehensive ROS was negative.  Exam:   BP (!) 146/96 (BP Location: Right Arm, Patient Position: Sitting, Cuff Size: Normal)   Pulse 66   Resp 14   Ht 5\' 4"  (1.626 m)   LMP 05/29/2014   BMI 33.81 kg/m  Height: 5\' 4"  (162.6 cm) Ht Readings from Last 3 Encounters:  12/08/17 5\' 4"  (1.626 m)  11/19/16 5' 2.75" (1.594 m)  04/30/16 5' 3.25" (1.607 m)    General appearance: alert, cooperative and appears stated age Head: Normocephalic, without obvious abnormality, atraumatic Neck: no adenopathy, supple, symmetrical, trachea midline and thyroid normal to inspection and palpation Lungs: clear to auscultation bilaterally Breasts: normal appearance, no masses or tenderness, No nipple retraction or dimpling, No nipple discharge or bleeding, No axillary or supraclavicular adenopathy Heart: regular rate and rhythm Abdomen: soft, non-tender; no masses,  no organomegaly Extremities: extremities normal, atraumatic, no cyanosis or edema Skin: Skin color, texture, turgor normal. No rashes or lesions Lymph nodes: Cervical, supraclavicular, and axillary nodes normal. No abnormal inguinal  nodes palpated Neurologic: Grossly normal   Pelvic: External genitalia:  no lesions              Urethra:  normal appearing urethra with no masses, tenderness or lesions              Bartholin's and Skene's: normal                 Vagina: normal appearing vagina with normal color and discharge, no lesions              Cervix: no cervical motion tenderness, no lesions and normal appearance              Pap taken: Yes.   Bimanual Exam:  Uterus:  normal size, contour, position, consistency, mobility, non-tender and mid position, no size change, history of fibroid in past              Adnexa: normal adnexa and no mass, fullness, tenderness                Rectovaginal: Confirms               Anus:  normal sphincter tone, no lesions  Chaperone present: yes  A:  Well Woman with normal exam  Menopausal no HRT  History of hypertension no medication now  Screening labs  Social stress with relocation and new job  Colonoscopy due declines  P:   Reviewed health and wellness pertinent to exam  Aware of need to advise if vaginal bleeding  Be sure and monitor BP and work on weight and good diet to prevent other health issues.  Labs: CBC, CMP, Lipid panel, TSH, Vit. D  Discussed cologard use and given brochure to check with insurance. Will call if desires to proceed.  Pap smear: yes   counseled on breast self exam, mammography screening, adequate intake of calcium and vitamin D, diet and exercise  return annually or prn  An After Visit Summary was printed and given to the patient.

## 2017-12-08 NOTE — Patient Instructions (Signed)

## 2017-12-09 LAB — COMPREHENSIVE METABOLIC PANEL
ALK PHOS: 76 IU/L (ref 39–117)
ALT: 30 IU/L (ref 0–32)
AST: 26 IU/L (ref 0–40)
Albumin/Globulin Ratio: 2.2 (ref 1.2–2.2)
Albumin: 4.6 g/dL (ref 3.5–5.5)
BUN / CREAT RATIO: 19 (ref 9–23)
BUN: 15 mg/dL (ref 6–24)
Bilirubin Total: 1.1 mg/dL (ref 0.0–1.2)
CO2: 24 mmol/L (ref 20–29)
CREATININE: 0.8 mg/dL (ref 0.57–1.00)
Calcium: 9.5 mg/dL (ref 8.7–10.2)
Chloride: 103 mmol/L (ref 96–106)
GFR calc Af Amer: 96 mL/min/{1.73_m2} (ref >59)
GFR calc non Af Amer: 83 mL/min/{1.73_m2} (ref >59)
Globulin, Total: 2.1 g/dL (ref 1.5–4.5)
Glucose: 89 mg/dL (ref 65–99)
Potassium: 4.2 mmol/L (ref 3.5–5.2)
Sodium: 140 mmol/L (ref 134–144)
Total Protein: 6.7 g/dL (ref 6.0–8.5)

## 2017-12-09 LAB — CBC
Hematocrit: 42 % (ref 34.0–46.6)
Hemoglobin: 14.7 g/dL (ref 11.1–15.9)
MCH: 31.3 pg (ref 26.6–33.0)
MCHC: 35 g/dL (ref 31.5–35.7)
MCV: 89 fL (ref 79–97)
PLATELETS: 218 10*3/uL (ref 150–450)
RBC: 4.7 x10E6/uL (ref 3.77–5.28)
RDW: 13.3 % (ref 12.3–15.4)
WBC: 5 10*3/uL (ref 3.4–10.8)

## 2017-12-09 LAB — TSH: TSH: 1.85 u[IU]/mL (ref 0.450–4.500)

## 2017-12-09 LAB — LIPID PANEL
CHOL/HDL RATIO: 2.8 ratio (ref 0.0–4.4)
Cholesterol, Total: 170 mg/dL (ref 100–199)
HDL: 61 mg/dL (ref >39)
LDL Calculated: 92 mg/dL (ref 0–99)
TRIGLYCERIDES: 87 mg/dL (ref 0–149)
VLDL Cholesterol Cal: 17 mg/dL (ref 5–40)

## 2017-12-09 LAB — CYTOLOGY - PAP
Diagnosis: NEGATIVE
HPV: NOT DETECTED

## 2017-12-09 LAB — VITAMIN D 25 HYDROXY (VIT D DEFICIENCY, FRACTURES): Vit D, 25-Hydroxy: 55.7 ng/mL (ref 30.0–100.0)

## 2017-12-20 ENCOUNTER — Encounter: Payer: Self-pay | Admitting: Certified Nurse Midwife

## 2017-12-20 DIAGNOSIS — Z1212 Encounter for screening for malignant neoplasm of rectum: Secondary | ICD-10-CM | POA: Diagnosis not present

## 2017-12-20 DIAGNOSIS — Z1211 Encounter for screening for malignant neoplasm of colon: Secondary | ICD-10-CM | POA: Diagnosis not present

## 2017-12-21 LAB — COLOGUARD

## 2018-01-11 ENCOUNTER — Telehealth: Payer: Self-pay | Admitting: Emergency Medicine

## 2018-01-11 DIAGNOSIS — R195 Other fecal abnormalities: Secondary | ICD-10-CM

## 2018-01-11 NOTE — Telephone Encounter (Signed)
Call to patient with printed cologuard results signed by Melvia Heaps CNM.   Positive screening result to patient. Advised recommendation is consult with GI and then colonoscopy.   Pt agreeable, would like to plan colonoscopy for while she is in town. Is planning a move to [place].   Referral sent to Dr. Collene Mares at this time and records sent.   Advised patient she will be contacted to schedule appointment.  Verbalized understanding and will call back with any questions or concerns PRN.  Encounter closed.

## 2018-02-10 DIAGNOSIS — R195 Other fecal abnormalities: Secondary | ICD-10-CM | POA: Diagnosis not present

## 2018-02-10 DIAGNOSIS — Z1211 Encounter for screening for malignant neoplasm of colon: Secondary | ICD-10-CM | POA: Diagnosis not present

## 2018-03-02 ENCOUNTER — Telehealth: Payer: Self-pay | Admitting: Certified Nurse Midwife

## 2018-03-02 DIAGNOSIS — R195 Other fecal abnormalities: Secondary | ICD-10-CM

## 2018-03-02 NOTE — Telephone Encounter (Signed)
Patient is calling regarding a previous referral from Melvia Heaps, CNM. Patient states she moved away never did go to the referral. She asked if she could have a referral in her new location?

## 2018-03-03 NOTE — Telephone Encounter (Signed)
Return call to patient.  She has moved to [place], [place].  Needs to follow up regarding positive cologuard testing.  Needs colonscopy.  Requests referral to  [facility], [facility] Address: [place], [place]   Phone: [phone]  Fax: [phone]   Requests female provider.  Referral entered.   Advised to call back if does not hear from their office in 5-7 business days.   Routing chart to Magdalene Taren to process records to office and Melvia Heaps CNM to update.  Will close telephone encounter.

## 2018-03-03 NOTE — Telephone Encounter (Signed)
Return call to patient. Left message to return my call.  Needs GI referral if has not seen GI for positive screen.  Need new address and location. Patient can be seen at any GI needs colonoscopy.

## 2018-03-03 NOTE — Telephone Encounter (Signed)
Patient returning Tracy's call. °

## 2018-05-04 DIAGNOSIS — R195 Other fecal abnormalities: Secondary | ICD-10-CM | POA: Diagnosis not present

## 2018-05-04 DIAGNOSIS — D123 Benign neoplasm of transverse colon: Secondary | ICD-10-CM | POA: Diagnosis not present

## 2018-05-04 DIAGNOSIS — K648 Other hemorrhoids: Secondary | ICD-10-CM | POA: Diagnosis not present

## 2018-12-14 ENCOUNTER — Ambulatory Visit: Payer: BLUE CROSS/BLUE SHIELD | Admitting: Certified Nurse Midwife

## 2019-01-11 ENCOUNTER — Telehealth: Payer: Self-pay | Admitting: Certified Nurse Midwife

## 2019-01-11 NOTE — Telephone Encounter (Signed)
Patient need an order for routine mammogram faxed to [facility]. Office number is [phone] and fax [phone].

## 2019-01-11 NOTE — Telephone Encounter (Signed)
Placed order on [place] desk

## 2019-01-11 NOTE — Telephone Encounter (Signed)
Order faxed. Encounter closed.

## 2019-01-11 NOTE — Telephone Encounter (Signed)
Order to French Ana, CNM for review.    Last annual 12-08-2017. Scheduled for 02-13-2019.

## 2019-01-16 ENCOUNTER — Ambulatory Visit: Payer: BLUE CROSS/BLUE SHIELD | Admitting: Certified Nurse Midwife

## 2019-02-13 ENCOUNTER — Ambulatory Visit: Payer: BLUE CROSS/BLUE SHIELD | Admitting: Certified Nurse Midwife

## 2019-02-22 ENCOUNTER — Other Ambulatory Visit: Payer: Self-pay

## 2019-02-24 ENCOUNTER — Encounter: Payer: Self-pay | Admitting: Certified Nurse Midwife

## 2019-02-24 ENCOUNTER — Ambulatory Visit: Payer: BC Managed Care – PPO | Admitting: Certified Nurse Midwife

## 2019-02-24 ENCOUNTER — Other Ambulatory Visit: Payer: Self-pay

## 2019-02-24 VITALS — BP 120/78 | HR 70 | Temp 97.1°F | Resp 16 | Ht 62.75 in

## 2019-02-24 DIAGNOSIS — E663 Overweight: Secondary | ICD-10-CM

## 2019-02-24 DIAGNOSIS — E559 Vitamin D deficiency, unspecified: Secondary | ICD-10-CM | POA: Diagnosis not present

## 2019-02-24 DIAGNOSIS — Z01419 Encounter for gynecological examination (general) (routine) without abnormal findings: Secondary | ICD-10-CM

## 2019-02-24 DIAGNOSIS — Z23 Encounter for immunization: Secondary | ICD-10-CM

## 2019-02-24 DIAGNOSIS — Z0000 Encounter for general adult medical examination without abnormal findings: Secondary | ICD-10-CM

## 2019-02-24 NOTE — Patient Instructions (Signed)

## 2019-02-24 NOTE — Progress Notes (Signed)
58 y.o. G0P0000 Married  Caucasian Fe here for annual exam. Post menopausal, and no symptoms. Occasional vaginal dryness. Had colonoscopy, with polyp repeat 3 years. Working on weight loss aware she has gained with working at home. Walking daily. No other health issues today.  Patient's last menstrual period was 05/29/2014.          Sexually active: No.  The current method of family planning is post menopausal status.    Exercising: Yes.    walking Smoker:  no  Review of Systems  Constitutional: Negative.   HENT: Negative.   Eyes: Negative.   Respiratory: Negative.   Cardiovascular: Negative.   Gastrointestinal: Negative.   Genitourinary: Negative.   Musculoskeletal: Negative.   Skin: Negative.   Neurological: Negative.   Endo/Heme/Allergies: Negative.   Psychiatric/Behavioral: Negative.     Health Maintenance: Pap: 11-19-16 neg HPV HR neg, 12-08-17 neg HPV HR neg History of Abnormal Pap: no MMG:  09-28-17 neg  scheduled for next week Self Breast exams: yes Colonoscopy: 12/19 f/u 53yrs BMD:   none TDaP:  06-26-15  Shingles: no Pneumonia: no Hep C and HIV: maybe when she had a surgery Labs: if needed.   reports that she has never smoked. She has never used smokeless tobacco. She reports previous alcohol use. She reports that she does not use drugs.  Past Medical History:  Diagnosis Date  . Arthritis    in back  . Fibroid   . Hypertension     Past Surgical History:  Procedure Laterality Date  . ANKLE FRACTURE SURGERY Left 2009  . APPENDECTOMY  1982    Current Outpatient Medications  Medication Sig Dispense Refill  . ibuprofen (ADVIL,MOTRIN) 200 MG tablet Take by mouth.    . levocetirizine (XYZAL) 5 MG tablet Take 5 mg by mouth every evening.    . Multiple Vitamins-Minerals (MULTIVITAMIN PO) Take by mouth.     No current facility-administered medications for this visit.     Family History  Problem Relation Age of Onset  . Cancer Mother        lung  . Diabetes  Sister   . Cancer Father        brain  . Hypertension Father   . Cancer Maternal Grandmother        brain  . Cancer Maternal Grandfather        throat  . Stroke Paternal Grandfather     ROS:  Pertinent items are noted in HPI.  Otherwise, a comprehensive ROS was negative.  Exam:   BP 120/78   Pulse 70   Temp (!) 97.1 F (36.2 C) (Skin)   Resp 16   Ht 5' 2.75" (1.594 m)   LMP 05/29/2014   BMI 35.18 kg/m  Height: 5' 2.75" (159.4 cm) Ht Readings from Last 3 Encounters:  02/24/19 5' 2.75" (1.594 m)  12/08/17 5\' 4"  (1.626 m)  11/19/16 5' 2.75" (1.594 m)    General appearance: alert, cooperative and appears stated age Head: Normocephalic, without obvious abnormality, atraumatic Neck: no adenopathy, supple, symmetrical, trachea midline and thyroid normal to inspection and palpation Lungs: clear to auscultation bilaterally Breasts: normal appearance, no masses or tenderness, No nipple retraction or dimpling, No nipple discharge or bleeding, No axillary or supraclavicular adenopathy Heart: regular rate and rhythm Abdomen: soft, non-tender; no masses,  no organomegaly Extremities: extremities normal, atraumatic, no cyanosis or edema Skin: Skin color, texture, turgor normal. No rashes or lesions Lymph nodes: Cervical, supraclavicular, and axillary nodes normal. No abnormal inguinal nodes  palpated Neurologic: Grossly normal   Pelvic: External genitalia:  no lesions              Urethra:  normal appearing urethra with no masses, tenderness or lesions              Bartholin's and Skene's: normal                 Vagina: normal appearing vagina with normal color and discharge, no lesions              Cervix: no cervical motion tenderness, no lesions and normal appearance              Pap taken: No. Bimanual Exam:  Uterus:  normal size, contour, position, consistency, mobility, non-tender              Adnexa: normal adnexa and no mass, fullness, tenderness               Rectovaginal:  Confirms               Anus:  normal sphincter tone, no lesions  Chaperone present: yes  A:  Well Woman with normal exam  Menopausal no HRT  Vaginal dryness  Weight gain working on now  Immunization  Flu vaccine requested  Screening labs  P:   Reviewed health and wellness pertinent to exam   Aware of need to advise if vaginal bleeding.  Discussed Olive or coconut oil use for dryness.  Encouraged to continue weight journey.  Flu vaccine requested  Labs: Lipid panel, CMP, Vitamin D, CBC, Hgb A1-c  Pap smear: no   counseled on breast self exam, mammography screening, feminine hygiene, adequate intake of calcium and vitamin D, diet and exercise  return annually or prn  An After Visit Summary was printed and given to the patient.

## 2019-02-25 LAB — LIPID PANEL
Chol/HDL Ratio: 3.4 ratio (ref 0.0–4.4)
Cholesterol, Total: 197 mg/dL (ref 100–199)
HDL: 58 mg/dL (ref >39)
LDL Chol Calc (NIH): 122 mg/dL — ABNORMAL HIGH (ref 0–99)
Triglycerides: 96 mg/dL (ref 0–149)
VLDL Cholesterol Cal: 17 mg/dL (ref 5–40)

## 2019-02-25 LAB — CBC
Hematocrit: 46.3 % (ref 34.0–46.6)
Hemoglobin: 16 g/dL — ABNORMAL HIGH (ref 11.1–15.9)
MCH: 31.7 pg (ref 26.6–33.0)
MCHC: 34.6 g/dL (ref 31.5–35.7)
MCV: 92 fL (ref 79–97)
Platelets: 263 10*3/uL (ref 150–450)
RBC: 5.04 x10E6/uL (ref 3.77–5.28)
RDW: 12.5 % (ref 11.7–15.4)
WBC: 6.3 10*3/uL (ref 3.4–10.8)

## 2019-02-25 LAB — HEMOGLOBIN A1C
Est. average glucose Bld gHb Est-mCnc: 103 mg/dL
Hgb A1c MFr Bld: 5.2 % (ref 4.8–5.6)

## 2019-02-25 LAB — TSH: TSH: 1.73 u[IU]/mL (ref 0.450–4.500)

## 2019-02-25 LAB — VITAMIN D 25 HYDROXY (VIT D DEFICIENCY, FRACTURES): Vit D, 25-Hydroxy: 44.9 ng/mL (ref 30.0–100.0)

## 2019-02-27 ENCOUNTER — Telehealth: Payer: Self-pay

## 2019-02-27 NOTE — Telephone Encounter (Signed)
Patient notified of results. See lab 

## 2019-02-27 NOTE — Telephone Encounter (Signed)
Left message for call back.

## 2019-02-27 NOTE — Telephone Encounter (Signed)
-----   Message from Regina Eck, CNM sent at 02/26/2019  7:15 PM EDT ----- Notify patient her CBC is essentially normal, her Hgb. Was borderline high. If she is on OTC vitamins with extra iron. she needs to stop the ones with iron and just take multivitamin Hgb. A1-C is normal at 5.2 Lipid panel shows normal cholesterol at 197, triglycerides normal at 96 HDL 58, LDL is borderline high at 122, normal is<99 TSH is normal Vitamin D is normal and good range at 44.9

## 2019-02-28 DIAGNOSIS — Z1231 Encounter for screening mammogram for malignant neoplasm of breast: Secondary | ICD-10-CM | POA: Diagnosis not present

## 2019-08-16 ENCOUNTER — Encounter: Payer: Self-pay | Admitting: Certified Nurse Midwife

## 2019-10-21 DIAGNOSIS — R110 Nausea: Secondary | ICD-10-CM | POA: Diagnosis not present

## 2019-10-21 DIAGNOSIS — H538 Other visual disturbances: Secondary | ICD-10-CM | POA: Diagnosis not present

## 2019-10-21 DIAGNOSIS — I10 Essential (primary) hypertension: Secondary | ICD-10-CM | POA: Diagnosis not present

## 2019-10-21 DIAGNOSIS — R42 Dizziness and giddiness: Secondary | ICD-10-CM | POA: Diagnosis not present

## 2019-10-21 DIAGNOSIS — I160 Hypertensive urgency: Secondary | ICD-10-CM | POA: Diagnosis not present

## 2019-10-25 DIAGNOSIS — I10 Essential (primary) hypertension: Secondary | ICD-10-CM | POA: Diagnosis not present

## 2019-10-25 DIAGNOSIS — F419 Anxiety disorder, unspecified: Secondary | ICD-10-CM | POA: Diagnosis not present

## 2019-11-08 DIAGNOSIS — F419 Anxiety disorder, unspecified: Secondary | ICD-10-CM | POA: Diagnosis not present

## 2019-11-08 DIAGNOSIS — I10 Essential (primary) hypertension: Secondary | ICD-10-CM | POA: Diagnosis not present

## 2019-11-23 DIAGNOSIS — I10 Essential (primary) hypertension: Secondary | ICD-10-CM | POA: Diagnosis not present

## 2020-02-26 ENCOUNTER — Ambulatory Visit: Payer: BC Managed Care – PPO | Admitting: Certified Nurse Midwife

## 2020-02-29 DIAGNOSIS — I10 Essential (primary) hypertension: Secondary | ICD-10-CM | POA: Diagnosis not present

## 2020-03-04 ENCOUNTER — Telehealth: Payer: Self-pay

## 2020-03-04 NOTE — Telephone Encounter (Signed)
Message not needed. °

## 2020-03-05 ENCOUNTER — Encounter: Payer: Self-pay | Admitting: Cardiology

## 2020-03-05 DIAGNOSIS — Z1231 Encounter for screening mammogram for malignant neoplasm of breast: Secondary | ICD-10-CM | POA: Diagnosis not present

## 2020-03-07 ENCOUNTER — Ambulatory Visit: Payer: BC Managed Care – PPO | Admitting: Obstetrics and Gynecology

## 2020-08-20 NOTE — Progress Notes (Signed)
60 y.o. G0P0000 Married White or Caucasian Not Hispanic or Latino female here for annual exam.      No vaginal bleeding. Not sexually active. No vaginal c/o. No vasomotor symptoms.   Occasional GSI with a full bladder and valsalva. Tolerable  No bowel c/o.    Patient's last menstrual period was 05/29/2014.          Sexually active: No.  The current method of family planning is post menopausal status.    Exercising: Yes.    walking Smoker:  no  Health Maintenance: Pap: : 12-08-17 neg HPV HR neg   11-19-16 neg HPV HR neg, History of abnormal Pap:  no MMG:  03/05/20 Bi-rads 1 neg  BMD:   None  Colonoscopy: 12/19 f/u 3 yrs, she will schedule.   TDaP:  06/26/15  Gardasil: none    reports that she has never smoked. She has never used smokeless tobacco. She reports previous alcohol use. She reports that she does not use drugs. She is a Scientist, research (medical).   Past Medical History:  Diagnosis Date  . Arthritis    in back  . Fibroid   . Hypertension     Past Surgical History:  Procedure Laterality Date  . ANKLE FRACTURE SURGERY Left 2009  . APPENDECTOMY  1982    Current Outpatient Medications  Medication Sig Dispense Refill  . ibuprofen (ADVIL,MOTRIN) 200 MG tablet Take by mouth.    . levocetirizine (XYZAL) 5 MG tablet Take 5 mg by mouth every evening.    . Multiple Vitamins-Minerals (MULTIVITAMIN PO) Take by mouth.    . losartan-hydrochlorothiazide (HYZAAR) 100-25 MG tablet Take 1 tablet by mouth daily.     No current facility-administered medications for this visit.    Family History  Problem Relation Age of Onset  . Cancer Mother        lung  . Diabetes Sister   . Cancer Father        brain  . Hypertension Father   . Cancer Maternal Grandmother        brain  . Cancer Maternal Grandfather        throat  . Stroke Paternal Grandfather     Review of Systems  All other systems reviewed and are negative.   Exam:   BP 132/82   Pulse (!) 104   Ht 5\' 3"  (1.6 m)   Wt  209 lb (94.8 kg)   LMP 05/29/2014   SpO2 98%   BMI 37.02 kg/m   Weight change: @WEIGHTCHANGE @ Height:   Height: 5\' 3"  (160 cm)  Ht Readings from Last 3 Encounters:  08/21/20 5\' 3"  (1.6 m)  02/24/19 5' 2.75" (1.594 m)  12/08/17 5\' 4"  (1.626 m)    General appearance: alert, cooperative and appears stated age Head: Normocephalic, without obvious abnormality, atraumatic Neck: no adenopathy, supple, symmetrical, trachea midline and thyroid normal to inspection and palpation Lungs: clear to auscultation bilaterally Cardiovascular: regular rate and rhythm Breasts: normal appearance, no masses or tenderness Abdomen: soft, non-tender; non distended,  no masses,  no organomegaly Extremities: extremities normal, atraumatic, no cyanosis or edema Skin: Skin color, texture, turgor normal. No rashes or lesions Lymph nodes: Cervical, supraclavicular, and axillary nodes normal. No abnormal inguinal nodes palpated Neurologic: Grossly normal   Pelvic: External genitalia:  no lesions              Urethra:  normal appearing urethra with no masses, tenderness or lesions  Bartholins and Skenes: normal                 Vagina: normal appearing vagina with normal color and discharge, no lesions              Cervix: no lesions               Bimanual Exam:  Uterus:  no masses or tenderness              Adnexa: no mass, fullness, tenderness               Rectovaginal: Confirms               Anus:  normal sphincter tone, no lesions  Gae Dry chaperoned for the exam.  1. Well woman exam with routine gynecological exam Discussed breast self exam Discussed calcium and vit D intake No pap this year Mammogram is UTD Colonoscopy is due in 12/22  2. Vitamin D deficiency On vit d - VITAMIN D 25 Hydroxy (Vit-D Deficiency, Fractures)  3. BMI 37.0-37.9, adult - Hemoglobin A1c - TSH - Lipid panel  4. Laboratory exam ordered as part of routine general medical examination Not fasting -  CBC - Comprehensive metabolic panel - Lipid panel  5. GSI (genuine stress incontinence), female Kegel information given and discussed

## 2020-08-21 ENCOUNTER — Other Ambulatory Visit: Payer: Self-pay

## 2020-08-21 ENCOUNTER — Ambulatory Visit (INDEPENDENT_AMBULATORY_CARE_PROVIDER_SITE_OTHER): Payer: BC Managed Care – PPO | Admitting: Obstetrics and Gynecology

## 2020-08-21 ENCOUNTER — Encounter: Payer: Self-pay | Admitting: Obstetrics and Gynecology

## 2020-08-21 VITALS — BP 132/82 | HR 104 | Ht 63.0 in | Wt 209.0 lb

## 2020-08-21 DIAGNOSIS — Z6837 Body mass index (BMI) 37.0-37.9, adult: Secondary | ICD-10-CM

## 2020-08-21 DIAGNOSIS — E559 Vitamin D deficiency, unspecified: Secondary | ICD-10-CM

## 2020-08-21 DIAGNOSIS — Z0000 Encounter for general adult medical examination without abnormal findings: Secondary | ICD-10-CM | POA: Diagnosis not present

## 2020-08-21 DIAGNOSIS — Z01419 Encounter for gynecological examination (general) (routine) without abnormal findings: Secondary | ICD-10-CM | POA: Diagnosis not present

## 2020-08-21 DIAGNOSIS — N393 Stress incontinence (female) (male): Secondary | ICD-10-CM

## 2020-08-21 LAB — LIPID PANEL
Cholesterol: 203 mg/dL — ABNORMAL HIGH (ref <200)
HDL: 64 mg/dL (ref >=50)
Total CHOL/HDL Ratio: 3.2 (calc) (ref <5.0)

## 2020-08-21 LAB — COMPREHENSIVE METABOLIC PANEL: BUN: 13 mg/dL (ref 7–25)

## 2020-08-21 LAB — CBC: MCV: 93.4 fL (ref 80.0–100.0)

## 2020-08-21 NOTE — Patient Instructions (Signed)
Urinary Incontinence  Urinary incontinence refers to a condition in which a person is unable to control where and when to pass urine. A person with this condition will urinate when he or she does not mean to (involuntarily). What are the causes? This condition may be caused by:  Medicines.  Infections.  Constipation.  Overactive bladder muscles.  Weak bladder muscles.  Weak pelvic floor muscles. These muscles provide support for the bladder, intestine, and, in women, the uterus.  Enlarged prostate in men. The prostate is a gland near the bladder. When it gets too big, it can pinch the urethra. With the urethra blocked, the bladder can weaken and lose the ability to empty properly.  Surgery.  Emotional factors, such as anxiety, stress, or post-traumatic stress disorder (PTSD).  Pelvic organ prolapse. This happens in women when organs shift out of place and into the vagina. This shift can prevent the bladder and urethra from working properly. What increases the risk? The following factors may make you more likely to develop this condition:  Older age.  Obesity and physical inactivity.  Pregnancy and childbirth.  Menopause.  Diseases that affect the nerves or spinal cord (neurological diseases).  Long-term (chronic) coughing. This can increase pressure on the bladder and pelvic floor muscles. What are the signs or symptoms? Symptoms may vary depending on the type of urinary incontinence you have. They include:  A sudden urge to urinate, but passing urine involuntarily before you can get to a bathroom (urge incontinence).  Suddenly passing urine with any activity that forces urine to pass, such as coughing, laughing, exercise, or sneezing (stress incontinence).  Needing to urinate often, but urinating only a small amount, or constantly dribbling urine (overflow incontinence).  Urinating because you cannot get to the bathroom in time due to a physical disability, such as  arthritis or injury, or communication and thinking problems, such as Alzheimer disease (functional incontinence). How is this diagnosed? This condition may be diagnosed based on:  Your medical history.  A physical exam.  Tests, such as: ? Urine tests. ? X-rays of your kidney and bladder. ? Ultrasound. ? CT scan. ? Cystoscopy. In this procedure, a health care provider inserts a tube with a light and camera (cystoscope) through the urethra and into the bladder in order to check for problems. ? Urodynamic testing. These tests assess how well the bladder, urethra, and sphincter can store and release urine. There are different types of urodynamic tests, and they vary depending on what the test is measuring. To help diagnose your condition, your health care provider may recommend that you keep a log of when you urinate and how much you urinate. How is this treated? Treatment for this condition depends on the type of incontinence that you have and its cause. Treatment may include:  Lifestyle changes, such as: ? Quitting smoking. ? Maintaining a healthy weight. ? Staying active. Try to get 150 minutes of moderate-intensity exercise every week. Ask your health care provider which activities are safe for you. ? Eating a healthy diet.  Avoid high-fat foods, like fried foods.  Avoid refined carbohydrates like white bread and white rice.  Limit how much alcohol and caffeine you drink.  Increase your fiber intake. Foods such as fresh fruits, vegetables, beans, and whole grains are healthy sources of fiber.  Pelvic floor muscle exercises.  Bladder training, such as lengthening the amount of time between bathroom breaks, or using the bathroom at regular intervals.  Using techniques to suppress bladder urges.   This can include distraction techniques or controlled breathing exercises.  Medicines to relax the bladder muscles and prevent bladder spasms.  Medicines to help slow or prevent the  growth of a man's prostate.  Botox injections. These can help relax the bladder muscles.  Using pulses of electricity to help change bladder reflexes (electrical nerve stimulation).  For women, using a medical device to prevent urine leaks. This is a small, tampon-like, disposable device that is inserted into the urethra.  Injecting collagen or carbon beads (bulking agents) into the urinary sphincter. These can help thicken tissue and close the bladder opening.  Surgery. Follow these instructions at home: Lifestyle  Limit alcohol and caffeine. These can fill your bladder quickly and irritate it.  Keep yourself clean to help prevent odors and skin damage. Ask your doctor about special skin creams and cleansers that can protect the skin from urine.  Consider wearing pads or adult diapers. Make sure to change them regularly, and always change them right after experiencing incontinence. General instructions  Take over-the-counter and prescription medicines only as told by your health care provider.  Use the bathroom about every 3-4 hours, even if you do not feel the need to urinate. Try to empty your bladder completely every time. After urinating, wait a minute. Then try to urinate again.  Make sure you are in a relaxed position while urinating.  If your incontinence is caused by nerve problems, keep a log of the medicines you take and the times you go to the bathroom.  Keep all follow-up visits as told by your health care provider. This is important. Contact a health care provider if:  You have pain that gets worse.  Your incontinence gets worse. Get help right away if:  You have a fever or chills.  You are unable to urinate.  You have redness in your groin area or down your legs. Summary  Urinary incontinence refers to a condition in which a person is unable to control where and when to pass urine.  This condition may be caused by medicines, infection, weak bladder  muscles, weak pelvic floor muscles, enlargement of the prostate (in men), or surgery.  The following factors increase your risk for developing this condition: older age, obesity, pregnancy and childbirth, menopause, neurological diseases, and chronic coughing.  There are several types of urinary incontinence. They include urge incontinence, stress incontinence, overflow incontinence, and functional incontinence.  This condition is usually treated first with lifestyle and behavioral changes, such as quitting smoking, eating a healthier diet, and doing regular pelvic floor exercises. Other treatment options include medicines, bulking agents, medical devices, electrical nerve stimulation, or surgery. This information is not intended to replace advice given to you by your health care provider. Make sure you discuss any questions you have with your health care provider. Document Revised: 05/21/2017 Document Reviewed: 08/20/2016 Elsevier Patient Education  2021 [employer]   We recommended that you start or continue a regular exercise program for good health. Physical activity is anything that gets your body moving, some is better than none. The CDC recommends 150 minutes per week of Moderate-Intensity Aerobic Activity and 2 or more days of Muscle Strengthening Activity.  Benefits of exercise are limitless: helps weight loss/weight maintenance, improves mood and energy, helps with depression and anxiety, improves sleep, tones and strengthens muscles, improves balance, improves bone density, protects from chronic conditions such as heart disease, high blood pressure and diabetes and so much more. To learn more visit: WhyNotPoker.uy  DIET:  Good nutrition starts with a healthy diet of fruits, vegetables, whole grains, and lean protein sources. Drink plenty of water for hydration. Minimize empty calories, sodium, sweets. For more information about dietary  recommendations visit: GeekRegister.com.ee and http://schaefer-mitchell.com/  ALCOHOL:  Women should limit their alcohol intake to no more than 7 drinks/beers/glasses of wine (combined, not each!) per week. Moderation of alcohol intake to this level decreases your risk of breast cancer and liver damage.  If you are concerned that you may have a problem, or your friends have told you they are concerned about your drinking, there are many resources to help. A well-known program that is free, effective, and available to all people all over the nation is Alcoholics Anonymous.  Check out this site to learn more: BlockTaxes.se   CALCIUM AND VITAMIN D:  Adequate intake of calcium and Vitamin D are recommended for bone health.  You should be getting between 1000-1200 mg of calcium and 800 units of Vitamin D daily between diet and supplements  PAP SMEARS:  Pap smears, to check for cervical cancer or precancers,  have traditionally been done yearly, scientific advances have shown that most women can have pap smears less often.  However, every woman still should have a physical exam from her gynecologist every year. It will include a breast check, inspection of the vulva and vagina to check for abnormal growths or skin changes, a visual exam of the cervix, and then an exam to evaluate the size and shape of the uterus and ovaries. We will also provide age appropriate advice regarding health maintenance, like when you should have certain vaccines, screening for sexually transmitted diseases, bone density testing, colonoscopy, mammograms, etc.   MAMMOGRAMS:  All women over 5 years old should have a routine mammogram.   COLON CANCER SCREENING: Now recommend starting at age 21. At this time colonoscopy is not covered for routine screening until 50. There are take home tests that can be done between 45-49.   COLONOSCOPY:  Colonoscopy to screen for colon cancer is  recommended for all women at age 30.  We know, you hate the idea of the prep.  We agree, BUT, having colon cancer and not knowing it is worse!!  Colon cancer so often starts as a polyp that can be seen and removed at colonscopy, which can quite literally save your life!  And if your first colonoscopy is normal and you have no family history of colon cancer, most women don't have to have it again for 10 years.  Once every ten years, you can do something that may end up saving your life, right?  We will be happy to help you get it scheduled when you are ready.  Be sure to check your insurance coverage so you understand how much it will cost.  It may be covered as a preventative service at no cost, but you should check your particular policy.      Breast Self-Awareness Breast self-awareness means being familiar with how your breasts look and feel. It involves checking your breasts regularly and reporting any changes to your health care provider. Practicing breast self-awareness is important. A change in your breasts can be a sign of a serious medical problem. Being familiar with how your breasts look and feel allows you to find any problems early, when treatment is more likely to be successful. All women should practice breast self-awareness, including women who have had breast implants. How to do a breast self-exam One way to learn what  is normal for your breasts and whether your breasts are changing is to do a breast self-exam. To do a breast self-exam: Look for Changes  1. Remove all the clothing above your waist. 2. Stand in front of a mirror in a room with good lighting. 3. Put your hands on your hips. 4. Push your hands firmly downward. 5. Compare your breasts in the mirror. Look for differences between them (asymmetry), such as: ? Differences in shape. ? Differences in size. ? Puckers, dips, and bumps in one breast and not the other. 6. Look at each breast for changes in your skin, such  as: ? Redness. ? Scaly areas. 7. Look for changes in your nipples, such as: ? Discharge. ? Bleeding. ? Dimpling. ? Redness. ? A change in position. Feel for Changes Carefully feel your breasts for lumps and changes. It is best to do this while lying on your back on the floor and again while sitting or standing in the shower or tub with soapy water on your skin. Feel each breast in the following way:  Place the arm on the side of the breast you are examining above your head.  Feel your breast with the other hand.  Start in the nipple area and make  inch (2 cm) overlapping circles to feel your breast. Use the pads of your three middle fingers to do this. Apply light pressure, then medium pressure, then firm pressure. The light pressure will allow you to feel the tissue closest to the skin. The medium pressure will allow you to feel the tissue that is a little deeper. The firm pressure will allow you to feel the tissue close to the ribs.  Continue the overlapping circles, moving downward over the breast until you feel your ribs below your breast.  Move one finger-width toward the center of the body. Continue to use the  inch (2 cm) overlapping circles to feel your breast as you move slowly up toward your collarbone.  Continue the up and down exam using all three pressures until you reach your armpit.  Write Down What You Find  Write down what is normal for each breast and any changes that you find. Keep a written record with breast changes or normal findings for each breast. By writing this information down, you do not need to depend only on memory for size, tenderness, or location. Write down where you are in your menstrual cycle, if you are still menstruating. If you are having trouble noticing differences in your breasts, do not get discouraged. With time you will become more familiar with the variations in your breasts and more comfortable with the exam. How often should I examine my  breasts? Examine your breasts every month. If you are breastfeeding, the best time to examine your breasts is after a feeding or after using a breast pump. If you menstruate, the best time to examine your breasts is 5-7 days after your period is over. During your period, your breasts are lumpier, and it may be more difficult to notice changes. When should I see my health care provider? See your health care provider if you notice:  A change in shape or size of your breasts or nipples.  A change in the skin of your breast or nipples, such as a reddened or scaly area.  Unusual discharge from your nipples.  A lump or thick area that was not there before.  Pain in your breasts.  Anything that concerns you.   Kegel Exercises  Kegel exercises can help strengthen your pelvic floor muscles. The pelvic floor is a group of muscles that support your rectum, small intestine, and bladder. In females, pelvic floor muscles also help support the womb (uterus). These muscles help you control the flow of urine and stool. Kegel exercises are painless and simple, and they do not require any equipment. Your provider may suggest Kegel exercises to:  Improve bladder and bowel control.  Improve sexual response.  Improve weak pelvic floor muscles after surgery to remove the uterus (hysterectomy) or pregnancy (females).  Improve weak pelvic floor muscles after prostate gland removal or surgery (males). Kegel exercises involve squeezing your pelvic floor muscles, which are the same muscles you squeeze when you try to stop the flow of urine or keep from passing gas. The exercises can be done while sitting, standing, or lying down, but it is best to vary your position. Exercises How to do Kegel exercises: 1. Squeeze your pelvic floor muscles tight. You should feel a tight lift in your rectal area. If you are a female, you should also feel a tightness in your vaginal area. Keep your stomach, buttocks, and legs  relaxed. 2. Hold the muscles tight for up to 10 seconds. 3. Breathe normally. 4. Relax your muscles. 5. Repeat as told by your health care provider. Repeat this exercise daily as told by your health care provider. Continue to do this exercise for at least 4-6 weeks, or for as long as told by your health care provider. You may be referred to a physical therapist who can help you learn more about how to do Kegel exercises. Depending on your condition, your health care provider may recommend:  Varying how long you squeeze your muscles.  Doing several sets of exercises every day.  Doing exercises for several weeks.  Making Kegel exercises a part of your regular exercise routine. This information is not intended to replace advice given to you by your health care provider. Make sure you discuss any questions you have with your health care provider. Document Revised: 09/15/2019 Document Reviewed: 12/29/2017 Elsevier Patient Education  [place].

## 2020-08-22 LAB — TSH: TSH: 1.54 mIU/L (ref 0.40–4.50)

## 2020-08-22 LAB — COMPREHENSIVE METABOLIC PANEL
ALT: 25 U/L (ref 6–29)
CO2: 22 mmol/L (ref 20–32)
Glucose, Bld: 93 mg/dL (ref 65–99)
Total Bilirubin: 1 mg/dL (ref 0.2–1.2)

## 2020-08-22 LAB — VITAMIN D 25 HYDROXY (VIT D DEFICIENCY, FRACTURES): Vit D, 25-Hydroxy: 45 ng/mL (ref 30–100)

## 2020-08-22 LAB — CBC: Platelets: 247 10*3/uL (ref 140–400)

## 2020-08-23 LAB — CBC
HCT: 44.1 % (ref 35.0–45.0)
Hemoglobin: 15.1 g/dL (ref 11.7–15.5)
MCH: 32 pg (ref 27.0–33.0)
MCHC: 34.2 g/dL (ref 32.0–36.0)
MPV: 9.5 fL (ref 7.5–12.5)
RBC: 4.72 10*6/uL (ref 3.80–5.10)
RDW: 11.8 % (ref 11.0–15.0)
WBC: 5.4 10*3/uL (ref 3.8–10.8)

## 2020-08-23 LAB — COMPREHENSIVE METABOLIC PANEL
AG Ratio: 1.8 (calc) (ref 1.0–2.5)
AST: 22 U/L (ref 10–35)
Albumin: 4.6 g/dL (ref 3.6–5.1)
Alkaline phosphatase (APISO): 63 U/L (ref 37–153)
Calcium: 10.2 mg/dL (ref 8.6–10.4)
Chloride: 105 mmol/L (ref 98–110)
Creat: 0.79 mg/dL (ref 0.50–1.05)
Globulin: 2.5 g/dL (calc) (ref 1.9–3.7)
Potassium: 3.9 mmol/L (ref 3.5–5.3)
Sodium: 139 mmol/L (ref 135–146)
Total Protein: 7.1 g/dL (ref 6.1–8.1)

## 2020-08-23 LAB — LIPID PANEL
LDL Cholesterol (Calc): 119 mg/dL (calc) — ABNORMAL HIGH
Non-HDL Cholesterol (Calc): 139 mg/dL (calc) — ABNORMAL HIGH (ref <130)
Triglycerides: 94 mg/dL (ref <150)

## 2020-08-23 LAB — HEMOGLOBIN A1C
Hgb A1c MFr Bld: 5.1 % of total Hgb (ref <5.7)
Mean Plasma Glucose: 100 mg/dL
eAG (mmol/L): 5.5 mmol/L

## 2021-03-05 ENCOUNTER — Telehealth: Payer: Self-pay | Admitting: *Deleted

## 2021-03-05 NOTE — Telephone Encounter (Signed)
Patient called in response to [facility] calling her to confirm appt. Order will be faxed for her screening mammogram.

## 2021-03-05 NOTE — Telephone Encounter (Signed)
Received a call from Summer from [facility]. [place] Regarding new mammogram order they need for this patient visit tomorrow. Called patient to verify she needs this order. Last order was placed under previous Provider Ethel Rana. Once confirmed with patient I will fax order over to [phone] Contact Summer if I have anymore question at [phone]

## 2021-03-05 NOTE — Telephone Encounter (Signed)
Order faxed  Confirmation received

## 2021-03-07 ENCOUNTER — Encounter: Payer: Self-pay | Admitting: Obstetrics and Gynecology

## 2021-08-18 NOTE — Progress Notes (Deleted)
61 y.o. G0P0000 Married White or Caucasian Not Hispanic or Latino female here for annual exam.   ?  ? ?Patient's last menstrual period was 05/29/2014.          ?Sexually active: {yes no:314532}  ?The current method of family planning is {contraception:315051}.    ?Exercising: {yes no:314532}  {types:19826} ?Smoker:  {YES NO:22349} ? ?Health Maintenance: ?Pap:  12-08-17 neg HPV HR neg  ? 11-19-16 neg HPV HR neg, ?History of abnormal Pap:  no ?MMG:  03/07/21 Bi-rads 2 benign  ?BMD:   none ?Colonoscopy: 05/04/18 f/u 10 years  ?TDaP:  06/26/15  ?Gardasil: n/a ? ? reports that she has never smoked. She has never used smokeless tobacco. She reports that she does not currently use alcohol. She reports that she does not use drugs. ? ?Past Medical History:  ?Diagnosis Date  ? 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 Fibroid   ? Hypertension   ? ? ?Past Surgical History:  ?Procedure Laterality Date  ? ANKLE FRACTURE SURGERY Left 2009  ? APPENDECTOMY  1982  ? ? ?Current Outpatient Medications  ?Medication Sig Dispense Refill  ? ibuprofen (ADVIL,MOTRIN) 200 MG tablet Take by mouth.    ? levocetirizine (XYZAL) 5 MG tablet Take 5 mg by mouth every evening.    ? losartan-hydrochlorothiazide (HYZAAR) 100-25 MG tablet Take 1 tablet by mouth daily.    ? Multiple Vitamins-Minerals (MULTIVITAMIN PO) Take by mouth.    ? ?No current facility-administered medications for this visit.  ? ? ?Family History  ?Problem Relation Age of Onset  ? Cancer Mother   ?     lung  ? Diabetes Sister   ? Cancer Father   ?     brain  ? Hypertension Father   ? Cancer Maternal Grandmother   ?     brain  ? Cancer Maternal Grandfather   ?     throat  ? Stroke Paternal Grandfather   ? ? ?Review of Systems ? ?Exam:   ?LMP 05/29/2014   Weight change: '@WEIGHTCHANGE'$ @ Height:      ?Ht Readings from Last 3 Encounters:  ?08/21/20 '5\' 3"'$  (1.6 m)  ?02/24/19 5' 2.75" (1.594 m)  ?12/08/17 '5\' 4"'$  (1.626 m)  ? ? ?General appearance: alert, cooperative and appears stated age ?Head: Normocephalic,  without obvious abnormality, atraumatic ?Neck: no adenopathy, supple, symmetrical, trachea midline and thyroid {CHL AMB PHY EX THYROID NORM DEFAULT:[phone]::"normal to inspection and palpation"} ?Lungs: clear to auscultation bilaterally ?Cardiovascular: regular rate and rhythm ?Breasts: {Exam; breast:13139::"normal appearance, no masses or tenderness"} ?Abdomen: soft, non-tender; non distended,  no masses,  no organomegaly ?Extremities: extremities normal, atraumatic, no cyanosis or edema ?Skin: Skin color, texture, turgor normal. No rashes or lesions ?Lymph nodes: Cervical, supraclavicular, and axillary nodes normal. ?No abnormal inguinal nodes palpated ?Neurologic: Grossly normal ? ? ?Pelvic: External genitalia:  no lesions ?             Urethra:  normal appearing urethra with no masses, tenderness or lesions ?             Bartholins and Skenes: normal    ?             Vagina: normal appearing vagina with normal color and discharge, no lesions ?             Cervix: {CHL AMB PHY EX CERVIX NORM DEFAULT:[id number]::"no lesions"} ?              ?Bimanual Exam:  Uterus:  {CHL  AMB PHY EX UTERUS NORM DEFAULT:[phone]::"normal size, contour, position, consistency, mobility, non-tender"} ?             Adnexa: {CHL AMB PHY EX ADNEXA NO MASS [phone]::"no mass, fullness, tenderness"} ?              Rectovaginal: Confirms ?              Anus:  normal sphincter tone, no lesions ? ?*** chaperoned for the exam. ? ?A:  Well Woman with normal exam ? ?P:    ? ? ? ?

## 2021-08-21 NOTE — Progress Notes (Signed)
61 y.o. G0P0000 Married White or Caucasian Not Hispanic or Latino female here for annual exam.  Not sexually active, no vaginal bleeding.  ? ?She is on medication for depression, seeing a counselor. Doing much better.  ?  ?Occasional GSI with a full bladder and valsalva. Tolerable. ? ?No bowel c/o.  ? ?Patient's last menstrual period was 05/29/2014.          ?Sexually active: No.  ?The current method of family planning is post menopausal status.    ?Exercising: Yes.     Walking  ?Smoker:  no ? ?Health Maintenance: ?Pap:  12-08-17 neg HPV HR neg  ? 11-19-16 neg HPV HR neg, ?History of abnormal Pap:  no ?MMG:  03/07/21 Bi-rads 2 benign  ?BMD:   none  ?Colonoscopy: 12/19 f/u 3 years. Has one scheduled for September 2023 ?TDaP:  06/26/15  ?Gardasil: none  ? ? reports that she has never smoked. She has never used smokeless tobacco. She reports that she does not currently use alcohol. She reports that she does not use drugs. No ETOH. She is a Scientist, research (medical).  ? ?Past Medical History:  ?Diagnosis Date  ? 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 Fibroid   ? Hypertension   ? ? ?Past Surgical History:  ?Procedure Laterality Date  ? ANKLE FRACTURE SURGERY Left 2009  ? APPENDECTOMY  1982  ? ? ?Current Outpatient Medications  ?Medication Sig Dispense Refill  ? buPROPion (WELLBUTRIN XL) 150 MG 24 hr tablet Take 150 mg by mouth every morning.    ? ibuprofen (ADVIL,MOTRIN) 200 MG tablet Take by mouth.    ? levocetirizine (XYZAL) 5 MG tablet Take 5 mg by mouth every evening.    ? losartan-hydrochlorothiazide (HYZAAR) 100-25 MG tablet Take 1 tablet by mouth daily.    ? Multiple Vitamins-Minerals (MULTIVITAMIN PO) Take by mouth.    ? sertraline (ZOLOFT) 100 MG tablet     ? ?No current facility-administered medications for this visit.  ? ? ?Family History  ?Problem Relation Age of Onset  ? Cancer Mother   ?     lung  ? Diabetes Sister   ? Cancer Father   ?     brain  ? Hypertension Father   ? Cancer Maternal Grandmother   ?     brain  ? Cancer Maternal  Grandfather   ?     throat  ? Stroke Paternal Grandfather   ? ? ?Review of Systems  ?All other systems reviewed and are negative. ? ?Exam:   ?BP 122/64 (BP Location: Right Arm, Patient Position: Sitting, Cuff Size: Large)   Pulse 76   Ht '5\' 3"'$  (1.6 m)   Wt 201 lb (91.2 kg)   LMP 05/29/2014   SpO2 95%   BMI 35.61 kg/m?   Weight change: '@WEIGHTCHANGE'$ @ Height:   Height: '5\' 3"'$  (160 cm)  ?Ht Readings from Last 3 Encounters:  ?09/01/21 '5\' 3"'$  (1.6 m)  ?08/21/20 '5\' 3"'$  (1.6 m)  ?02/24/19 5' 2.75" (1.594 m)  ? ? ?General appearance: alert, cooperative and appears stated age ?Head: Normocephalic, without obvious abnormality, atraumatic ?Neck: no adenopathy, supple, symmetrical, trachea midline and thyroid normal to inspection and palpation ?Lungs: clear to auscultation bilaterally ?Cardiovascular: regular rate and rhythm ?Breasts: normal appearance, no masses or tenderness ?Abdomen: soft, non-tender; non distended,  no masses,  no organomegaly ?Extremities: extremities normal, atraumatic, no cyanosis or edema ?Skin: Skin color, texture, turgor normal. No rashes or lesions ?Lymph nodes: Cervical, supraclavicular, and axillary nodes normal. ?  No abnormal inguinal nodes palpated ?Neurologic: Grossly normal ? ? ?Pelvic: External genitalia:  no lesions ?             Urethra:  normal appearing urethra with no masses, tenderness or lesions ?             Bartholins and Skenes: normal    ?             Vagina: atrophic appearing vagina with normal color and discharge, no lesions ?             Cervix: no lesions ?              ?Bimanual Exam:  Uterus:   no masses or tenderness ?             Adnexa: no mass, fullness, tenderness ?              Rectovaginal: Confirms ?              Anus:  normal sphincter tone, no lesions ? ?Gae Dry chaperoned for the exam. ? ?1. Well woman exam ?Discussed breast self exam ?Discussed calcium and vit D intake ?Pap next year ? ?2. Elevated LDL cholesterol level ?- Lipid panel ? ?3. BMI  35.0-35.9,adult ?- Lipid panel ?- Hemoglobin A1c ? ?4. Family history of diabetes mellitus (DM) ?- Hemoglobin A1c ? ?5. Laboratory exam ordered as part of routine general medical examination ?- CBC ?- Comprehensive metabolic panel ? ? ?

## 2021-08-25 ENCOUNTER — Ambulatory Visit: Payer: BC Managed Care – PPO | Admitting: Obstetrics and Gynecology

## 2021-09-01 ENCOUNTER — Ambulatory Visit (INDEPENDENT_AMBULATORY_CARE_PROVIDER_SITE_OTHER): Payer: BC Managed Care – PPO | Admitting: Obstetrics and Gynecology

## 2021-09-01 ENCOUNTER — Encounter: Payer: Self-pay | Admitting: Obstetrics and Gynecology

## 2021-09-01 VITALS — BP 122/64 | HR 76 | Ht 63.0 in | Wt 201.0 lb

## 2021-09-01 DIAGNOSIS — E7800 Pure hypercholesterolemia, unspecified: Secondary | ICD-10-CM

## 2021-09-01 DIAGNOSIS — Z6835 Body mass index (BMI) 35.0-35.9, adult: Secondary | ICD-10-CM

## 2021-09-01 DIAGNOSIS — Z0000 Encounter for general adult medical examination without abnormal findings: Secondary | ICD-10-CM

## 2021-09-01 DIAGNOSIS — Z01419 Encounter for gynecological examination (general) (routine) without abnormal findings: Secondary | ICD-10-CM

## 2021-09-01 DIAGNOSIS — Z833 Family history of diabetes mellitus: Secondary | ICD-10-CM

## 2021-09-01 NOTE — Patient Instructions (Signed)

## 2021-09-02 LAB — LIPID PANEL
Cholesterol: 203 mg/dL — ABNORMAL HIGH (ref <200)
HDL: 60 mg/dL (ref >=50)
LDL Cholesterol (Calc): 113 mg/dL (calc) — ABNORMAL HIGH
Non-HDL Cholesterol (Calc): 143 mg/dL (calc) — ABNORMAL HIGH (ref <130)
Total CHOL/HDL Ratio: 3.4 (calc) (ref <5.0)
Triglycerides: 189 mg/dL — ABNORMAL HIGH (ref <150)

## 2021-09-02 LAB — CBC
HCT: 43.9 % (ref 35.0–45.0)
Hemoglobin: 14.8 g/dL (ref 11.7–15.5)
MCH: 31.1 pg (ref 27.0–33.0)
MCHC: 33.7 g/dL (ref 32.0–36.0)
MCV: 92.2 fL (ref 80.0–100.0)
MPV: 9.5 fL (ref 7.5–12.5)
Platelets: 259 10*3/uL (ref 140–400)
RBC: 4.76 10*6/uL (ref 3.80–5.10)
RDW: 11.8 % (ref 11.0–15.0)
WBC: 5.7 10*3/uL (ref 3.8–10.8)

## 2021-09-02 LAB — COMPREHENSIVE METABOLIC PANEL
AG Ratio: 1.7 (calc) (ref 1.0–2.5)
ALT: 23 U/L (ref 6–29)
AST: 20 U/L (ref 10–35)
Albumin: 4.5 g/dL (ref 3.6–5.1)
Alkaline phosphatase (APISO): 76 U/L (ref 37–153)
BUN: 21 mg/dL (ref 7–25)
CO2: 29 mmol/L (ref 20–32)
Calcium: 9.8 mg/dL (ref 8.6–10.4)
Chloride: 102 mmol/L (ref 98–110)
Creat: 0.75 mg/dL (ref 0.50–1.03)
Globulin: 2.7 g/dL (calc) (ref 1.9–3.7)
Glucose, Bld: 91 mg/dL (ref 65–99)
Potassium: 4 mmol/L (ref 3.5–5.3)
Sodium: 138 mmol/L (ref 135–146)
Total Bilirubin: 0.6 mg/dL (ref 0.2–1.2)
Total Protein: 7.2 g/dL (ref 6.1–8.1)

## 2021-09-02 LAB — HEMOGLOBIN A1C
Hgb A1c MFr Bld: 5.2 % of total Hgb (ref <5.7)
Mean Plasma Glucose: 103 mg/dL
eAG (mmol/L): 5.7 mmol/L

## 2022-03-10 ENCOUNTER — Telehealth: Payer: Self-pay

## 2022-03-10 NOTE — Telephone Encounter (Signed)
Rep from imaging center in [place] calling to request mammogram order for pt who has an appt with them on Thursday 03/12/2022.   Will wait on the blank form to fill out and get authorized then will return fax to [phone]

## 2022-03-10 NOTE — Telephone Encounter (Signed)
Order was successfully faxed today.

## 2022-03-23 ENCOUNTER — Encounter: Payer: Self-pay | Admitting: Obstetrics and Gynecology

## 2022-03-24 NOTE — Telephone Encounter (Signed)
We received the imaging results as of 03/23/22.
